# Patient Record
Sex: Male | Born: 1970 | Race: White | Hispanic: No | Marital: Married | Smoking: Current every day smoker
Health system: Southern US, Community
[De-identification: ages and names within clinical notes are randomized; demographics above are authoritative.]

## PROBLEM LIST (undated history)

## (undated) DIAGNOSIS — R112 Nausea with vomiting, unspecified: Secondary | ICD-10-CM

## (undated) DIAGNOSIS — I639 Cerebral infarction, unspecified: Secondary | ICD-10-CM

## (undated) DIAGNOSIS — Z9889 Other specified postprocedural states: Secondary | ICD-10-CM

## (undated) DIAGNOSIS — T8859XA Other complications of anesthesia, initial encounter: Secondary | ICD-10-CM

## (undated) DIAGNOSIS — E7800 Pure hypercholesterolemia, unspecified: Secondary | ICD-10-CM

## (undated) DIAGNOSIS — M6282 Rhabdomyolysis: Secondary | ICD-10-CM

## (undated) DIAGNOSIS — T4145XA Adverse effect of unspecified anesthetic, initial encounter: Secondary | ICD-10-CM

## (undated) HISTORY — PX: PATELLA FRACTURE SURGERY: SHX735

## (undated) HISTORY — PX: ANKLE SURGERY: SHX546

## (undated) HISTORY — DX: Cerebral infarction, unspecified: I63.9

---

## 2014-09-03 ENCOUNTER — Encounter (HOSPITAL_BASED_OUTPATIENT_CLINIC_OR_DEPARTMENT_OTHER): Payer: Self-pay | Admitting: *Deleted

## 2014-09-03 ENCOUNTER — Emergency Department (HOSPITAL_BASED_OUTPATIENT_CLINIC_OR_DEPARTMENT_OTHER)
Admission: EM | Admit: 2014-09-03 | Discharge: 2014-09-03 | Disposition: A | Discharge status: Home / Self Care | Payer: Self-pay | Attending: Emergency Medicine | Admitting: Emergency Medicine

## 2014-09-03 DIAGNOSIS — L03115 Cellulitis of right lower limb: Secondary | ICD-10-CM | POA: Insufficient documentation

## 2014-09-03 DIAGNOSIS — Z720 Tobacco use: Secondary | ICD-10-CM | POA: Insufficient documentation

## 2014-09-03 DIAGNOSIS — Z792 Long term (current) use of antibiotics: Secondary | ICD-10-CM | POA: Insufficient documentation

## 2014-09-03 DIAGNOSIS — Z79899 Other long term (current) drug therapy: Secondary | ICD-10-CM | POA: Insufficient documentation

## 2014-09-03 LAB — BASIC METABOLIC PANEL
ANION GAP: 13 (ref 5–15)
BUN: 16 mg/dL (ref 6–23)
CALCIUM: 9 mg/dL (ref 8.4–10.5)
CO2: 24 meq/L (ref 19–32)
Chloride: 99 mEq/L (ref 96–112)
Creatinine, Ser: 1.1 mg/dL (ref 0.50–1.35)
GFR calc non Af Amer: 81 mL/min — ABNORMAL LOW (ref >90)
Glucose, Bld: 113 mg/dL — ABNORMAL HIGH (ref 70–99)
Potassium: 3.8 mEq/L (ref 3.7–5.3)
Sodium: 136 mEq/L — ABNORMAL LOW (ref 137–147)

## 2014-09-03 LAB — CBC WITH DIFFERENTIAL/PLATELET
BASOS ABS: 0 10*3/uL (ref 0.0–0.1)
BASOS PCT: 1 % (ref 0–1)
EOS PCT: 2 % (ref 0–5)
Eosinophils Absolute: 0.1 10*3/uL (ref 0.0–0.7)
HEMATOCRIT: 40.4 % (ref 39.0–52.0)
Hemoglobin: 13.9 g/dL (ref 13.0–17.0)
Lymphocytes Relative: 21 % (ref 12–46)
Lymphs Abs: 1.2 10*3/uL (ref 0.7–4.0)
MCH: 27.2 pg (ref 26.0–34.0)
MCHC: 34.4 g/dL (ref 30.0–36.0)
MCV: 79.1 fL (ref 78.0–100.0)
Monocytes Absolute: 0.8 10*3/uL (ref 0.1–1.0)
Monocytes Relative: 14 % — ABNORMAL HIGH (ref 3–12)
Neutro Abs: 3.6 10*3/uL (ref 1.7–7.7)
Neutrophils Relative %: 62 % (ref 43–77)
Platelets: 309 10*3/uL (ref 150–400)
RBC: 5.11 MIL/uL (ref 4.22–5.81)
RDW: 12.7 % (ref 11.5–15.5)
WBC: 5.7 10*3/uL (ref 4.0–10.5)

## 2014-09-03 MED ORDER — VANCOMYCIN HCL IN DEXTROSE 1-5 GM/200ML-% IV SOLN: 1000.0000 mg | Freq: Two times a day (BID) | INTRAVENOUS | Status: DC

## 2014-09-03 MED ORDER — VANCOMYCIN HCL IN DEXTROSE 1-5 GM/200ML-% IV SOLN
INTRAVENOUS | Status: AC
  Administered 2014-09-03: 1000 mg via INTRAVENOUS
  Filled 2014-09-03: qty 400

## 2014-09-03 MED ORDER — VANCOMYCIN HCL IN DEXTROSE 1-5 GM/200ML-% IV SOLN
1000.0000 mg | Freq: Once | INTRAVENOUS | Status: AC
  Administered 2014-09-03: 1000 mg via INTRAVENOUS

## 2014-09-03 MED ORDER — AMOXICILLIN-POT CLAVULANATE 875-125 MG PO TABS: 1.0000 | ORAL_TABLET | Freq: Two times a day (BID) | ORAL | Status: DC

## 2014-09-03 NOTE — Discharge Instructions (Signed)
Expect slow improvement of the redness in the leg. Complete the course of doxycycline for previous prescription. New prescription Augmentin, take both until completed. Twice per day (over the counter) probiotic to avoid diarrhea from antibiotics.  Cellulitis Cellulitis is an infection of the skin and the tissue under the skin. The infected area is usually red and tender. This happens most often in the arms and lower legs. HOME CARE   Take your antibiotic medicine as told. Finish the medicine even if you start to feel better.  Keep the infected arm or leg raised (elevated).  Put a warm cloth on the area up to 4 times per day.  Only take medicines as told by your doctor.  Keep all doctor visits as told. GET HELP IF:  You see red streaks on the skin coming from the infected area.  Your red area gets bigger or turns a dark color.  Your bone or joint under the infected area is painful after the skin heals.  Your infection comes back in the same area or different area.  You have a puffy (swollen) bump in the infected area.  You have new symptoms.  You have a fever. GET HELP RIGHT AWAY IF:   You feel very sleepy.  You throw up (vomit) or have watery poop (diarrhea).  You feel sick and have muscle aches and pains. MAKE SURE YOU:   Understand these instructions.  Will watch your condition.  Will get help right away if you are not doing well or get worse. Document Released: 02/25/2008 Document Revised: 01/23/2014 Document Reviewed: 11/24/2011 [facility] Patient Information 2015 [place], [place]. This information is not intended to replace advice given to you by your health care provider. Make sure you discuss any questions you have with your health care provider.

## 2014-09-03 NOTE — ED Notes (Signed)
Patient c/o fever over the past week and rash/infection on lower legs. Currently on doxy-cycline after going to urgent care

## 2014-09-03 NOTE — ED Provider Notes (Signed)
CSN: [id number]     Arrival date & time 09/03/14  1025 History   First MD Initiated Contact with Patient 09/03/14 0804     Chief Complaint  Patient presents with  . Cellulitis      HPI  Patient presents for evaluation of fever and rash on his legs. Started noticing some redness and tenderness on the right lower leg about 5 or 6 days ago. Fevers up to 100 at home each night. Some sweats at night as well. He has been active. He continues to work. He is on his feet a lot. Seen in urgent care about 36 hours ago. Placed on doxycycline. Has had 3 doses. Was concerned that it was "getting worse", he presents here. Past medical history otherwise healthy. Previous ORIF of right lower leg fracture over 10 years ago.   History reviewed. No pertinent past medical history. Past Surgical History  Procedure Laterality Date  . Patella fracture surgery Right    No family history on file. History  Substance Use Topics  . Smoking status: Current Every Day Smoker  . Smokeless tobacco: Not on file  . Alcohol Use: Yes    Review of Systems  Constitutional: Positive for fever. Negative for chills, diaphoresis, appetite change and fatigue.  HENT: Negative for mouth sores, sore throat and trouble swallowing.   Eyes: Negative for visual disturbance.  Respiratory: Negative for cough, chest tightness, shortness of breath and wheezing.   Cardiovascular: Negative for chest pain.  Gastrointestinal: Negative for nausea, vomiting, abdominal pain, diarrhea and abdominal distention.  Endocrine: Negative for polydipsia, polyphagia and polyuria.  Genitourinary: Negative for dysuria, frequency and hematuria.  Musculoskeletal: Negative for gait problem.       Leg pain. Redness.  Skin: Negative for color change, pallor and rash.  Neurological: Negative for dizziness, syncope, light-headedness and headaches.  Hematological: Does not bruise/bleed easily.  Psychiatric/Behavioral: Negative for behavioral problems and  confusion.      Allergies  Review of patient's allergies indicates no known allergies.  Home Medications   Prior to Admission medications   Medication Sig Start Date End Date Taking? Authorizing Provider  cyclobenzaprine (FLEXERIL) 10 MG tablet Take 10 mg by mouth every 8 (eight) hours as needed for muscle spasms.   Yes Historical Provider, MD  doxycycline (DORYX) 100 MG DR capsule Take 100 mg by mouth 2 (two) times daily.   Yes Historical Provider, MD  ranitidine (ZANTAC) 150 MG tablet Take 150 mg by mouth daily.   Yes Historical Provider, MD  traMADol (ULTRAM) 50 MG tablet Take by mouth every 12 (twelve) hours as needed.   Yes Historical Provider, MD  amoxicillin-clavulanate (AUGMENTIN) 875-125 MG per tablet Take 1 tablet by mouth 2 (two) times daily. 09/03/14   Tanna Furry, MD   BP 125/74 mmHg  Pulse 67  Temp(Src) 98.1 F (36.7 C) (Oral)  Resp 18  Ht 6' (1.829 m)  Wt 235 lb (106.595 kg)  BMI 31.86 kg/m2  SpO2 99% Physical Exam  Constitutional: He is oriented to person, place, and time. He appears well-developed and well-nourished. No distress.  HENT:  Head: Normocephalic.  Eyes: Conjunctivae are normal. Pupils are equal, round, and reactive to light. No scleral icterus.  Neck: Normal range of motion. Neck supple. No thyromegaly present.  Cardiovascular: Normal rate and regular rhythm.  Exam reveals no gallop and no friction rub.   No murmur heard. Pulmonary/Chest: Effort normal and breath sounds normal. No respiratory distress. He has no wheezes. He has no rales.  Abdominal: Soft. Bowel sounds are normal. He exhibits no distension. There is no tenderness. There is no rebound.  Musculoskeletal: Normal range of motion.       Legs: Neurological: He is alert and oriented to person, place, and time.  Skin: Skin is warm and dry. No rash noted.  Psychiatric: He has a normal mood and affect. His behavior is normal.    ED Course  Procedures (including critical care time) Labs  Review Labs Reviewed  CBC WITH DIFFERENTIAL - Abnormal; Notable for the following:    Monocytes Relative 14 (*)    All other components within normal limits  BASIC METABOLIC PANEL - Abnormal; Notable for the following:    Sodium 136 (*)    Glucose, Bld 113 (*)    GFR calc non Af Amer 81 (*)    All other components within normal limits    Imaging Review No results found.   EKG Interpretation None      MDM   Final diagnoses:  Cellulitis of right lower extremity    Given IV vancomycin. Plan is discharge. Complete doxycycline. We will add Augmentin. Should be good MRSA, MSSA, and strep coverage. Legs are not asymmetric. No clinical signs of DVT. No compromise or breakdown of skin. I have advised daily probiotic to avoid antibiotics associated diarrhea. Expect slow resolution. Primary care with any failure to improve.    Tanna Furry, MD 09/03/14 [phone]

## 2014-09-03 NOTE — Progress Notes (Addendum)
ANTIBIOTIC CONSULT NOTE - INITIAL  Pharmacy Consult for vancomycin Indication: Cellulitis  No Known Allergies  Patient Measurements: Height: 6' (182.9 cm) Weight: 235 lb (106.595 kg) IBW/kg (Calculated) : 77.6  Vital Signs: Temp: 98.1 F (36.7 C) (12/13 0753) Temp Source: Oral (12/13 0753) BP: 143/83 mmHg (12/13 0753) Pulse Rate: 76 (12/13 0753) Intake/Output from previous day:   Intake/Output from this shift:    Labs: No results for input(s): WBC, HGB, PLT, LABCREA, CREATININE in the last 72 hours. CrCl cannot be calculated (Patient has no serum creatinine result on file.). No results for input(s): VANCOTROUGH, VANCOPEAK, VANCORANDOM, GENTTROUGH, GENTPEAK, GENTRANDOM, TOBRATROUGH, TOBRAPEAK, TOBRARND, AMIKACINPEAK, AMIKACINTROU, AMIKACIN in the last 72 hours.   Microbiology: No results found for this or any previous visit (from the past 720 hour(s)).  Medical History: History reviewed. No pertinent past medical history.  Assessment: 43 yo male with fever and lower leg rash to begin vancomycin for cellulitis. WBC= 5.7, afeb, SCr= 1.1 and CrCl > 100.  Goal of Therapy:  Vancomycin trough level 15-20 mcg/ml  Plan:  -Vancomycin 20101m IV load x1 followed by 10068mIV q12h -Will follow renal function and clinical progress  AnHildred LaserPharm D 09/03/2014 8:40 AM

## 2014-09-12 ENCOUNTER — Emergency Department (HOSPITAL_BASED_OUTPATIENT_CLINIC_OR_DEPARTMENT_OTHER): Payer: Self-pay

## 2014-09-12 ENCOUNTER — Encounter (HOSPITAL_BASED_OUTPATIENT_CLINIC_OR_DEPARTMENT_OTHER): Payer: Self-pay

## 2014-09-12 ENCOUNTER — Emergency Department (HOSPITAL_BASED_OUTPATIENT_CLINIC_OR_DEPARTMENT_OTHER)
Admission: EM | Admit: 2014-09-12 | Discharge: 2014-09-12 | Disposition: A | Discharge status: Home / Self Care | Payer: Self-pay | Attending: Emergency Medicine | Admitting: Emergency Medicine

## 2014-09-12 DIAGNOSIS — Z79899 Other long term (current) drug therapy: Secondary | ICD-10-CM | POA: Insufficient documentation

## 2014-09-12 DIAGNOSIS — R109 Unspecified abdominal pain: Secondary | ICD-10-CM

## 2014-09-12 DIAGNOSIS — R350 Frequency of micturition: Secondary | ICD-10-CM

## 2014-09-12 DIAGNOSIS — Z720 Tobacco use: Secondary | ICD-10-CM | POA: Insufficient documentation

## 2014-09-12 DIAGNOSIS — Z792 Long term (current) use of antibiotics: Secondary | ICD-10-CM | POA: Insufficient documentation

## 2014-09-12 DIAGNOSIS — I880 Nonspecific mesenteric lymphadenitis: Secondary | ICD-10-CM | POA: Insufficient documentation

## 2014-09-12 DIAGNOSIS — R1030 Lower abdominal pain, unspecified: Secondary | ICD-10-CM | POA: Insufficient documentation

## 2014-09-12 LAB — URINALYSIS, ROUTINE W REFLEX MICROSCOPIC
BILIRUBIN URINE: NEGATIVE
Glucose, UA: NEGATIVE mg/dL
Hgb urine dipstick: NEGATIVE
KETONES UR: NEGATIVE mg/dL
Leukocytes, UA: NEGATIVE
NITRITE: NEGATIVE
Protein, ur: NEGATIVE mg/dL
Specific Gravity, Urine: 1.009 (ref 1.005–1.030)
Urobilinogen, UA: 0.2 mg/dL (ref 0.0–1.0)
pH: 5.5 (ref 5.0–8.0)

## 2014-09-12 NOTE — ED Notes (Signed)
Patient denies pain and is resting comfortably.  

## 2014-09-12 NOTE — Discharge Instructions (Signed)
Follow-up with the primary care physician as scheduled next month. You will need reevaluation of the mesenteric adenitis in 3-6 months. Continue taking your antibiotics as prescribed.  Flank Pain Flank pain refers to pain that is located on the side of the body between the upper abdomen and the back. The pain may occur over a short period of time (acute) or may be long-term or reoccurring (chronic). It may be mild or severe. Flank pain can be caused by many things. CAUSES  Some of the more common causes of flank pain include:  Muscle strains.   Muscle spasms.   A disease of your spine (vertebral disk disease).   A lung infection (pneumonia).   Fluid around your lungs (pulmonary edema).   A kidney infection.   Kidney stones.   A very painful skin rash caused by the chickenpox virus (shingles).   Gallbladder disease.  [place] care will depend on the cause of your pain. In general,  Rest as directed by your caregiver.  Drink enough fluids to keep your urine clear or pale yellow.  Only take over-the-counter or prescription medicines as directed by your caregiver. Some medicines may help relieve the pain.  Tell your caregiver about any changes in your pain.  Follow up with your caregiver as directed. SEEK IMMEDIATE MEDICAL CARE IF:   Your pain is not controlled with medicine.   You have new or worsening symptoms.  Your pain increases.   You have abdominal pain.   You have shortness of breath.   You have persistent nausea or vomiting.   You have swelling in your abdomen.   You feel faint or pass out.   You have blood in your urine.  You have a fever or persistent symptoms for more than 2-3 days.  You have a fever and your symptoms suddenly get worse. MAKE SURE YOU:   Understand these instructions.  Will watch your condition.  Will get help right away if you are not doing well or get worse. Document Released: 10/30/2005  Document Revised: 06/02/2012 Document Reviewed: 04/22/2012 [facility] Patient Information 2015 [place], [place]. This information is not intended to replace advice given to you by your health care provider. Make sure you discuss any questions you have with your health care provider.  Lymphadenopathy Lymphadenopathy means "disease of the lymph glands." But the term is usually used to describe swollen or enlarged lymph glands, also called lymph nodes. These are the bean-shaped organs found in many locations including the neck, underarm, and groin. Lymph glands are part of the immune system, which fights infections in your body. Lymphadenopathy can occur in just one area of the body, such as the neck, or it can be generalized, with lymph node enlargement in several areas. The nodes found in the neck are the most common sites of lymphadenopathy. CAUSES When your immune system responds to germs (such as viruses or bacteria ), infection-fighting cells and fluid build up. This causes the glands to grow in size. Usually, this is not something to worry about. Sometimes, the glands themselves can become infected and inflamed. This is called lymphadenitis. Enlarged lymph nodes can be caused by many diseases:  Bacterial disease, such as strep throat or a skin infection.  Viral disease, such as a common cold.  Other germs, such as Lyme disease, tuberculosis, or sexually transmitted diseases.  Cancers, such as lymphoma (cancer of the lymphatic system) or leukemia (cancer of the white blood cells).  Inflammatory diseases such as lupus  or rheumatoid arthritis.  Reactions to medications. Many of the diseases above are rare, but important. This is why you should see your caregiver if you have lymphadenopathy. SYMPTOMS  Swollen, enlarged lumps in the neck, back of the head, or other locations.  Tenderness.  Warmth or redness of the skin over the lymph nodes.  Fever. DIAGNOSIS Enlarged lymph nodes are often  near the source of infection. They can help health care providers diagnose your illness. For instance:  Swollen lymph nodes around the jaw might be caused by an infection in the mouth.  Enlarged glands in the neck often signal a throat infection.  Lymph nodes that are swollen in more than one area often indicate an illness caused by a virus. Your caregiver will likely know what is causing your lymphadenopathy after listening to your history and examining you. Blood tests, x-rays, or other tests may be needed. If the cause of the enlarged lymph node cannot be found, and it does not go away by itself, then a biopsy may be needed. Your caregiver will discuss this with you. TREATMENT Treatment for your enlarged lymph nodes will depend on the cause. Many times the nodes will shrink to normal size by themselves, with no treatment. Antibiotics or other medicines may be needed for infection. Only take over-the-counter or prescription medicines for pain, discomfort, or fever as directed by your caregiver. HOME CARE INSTRUCTIONS Swollen lymph glands usually return to normal when the underlying medical condition goes away. If they persist, contact your health-care provider. He/she might prescribe antibiotics or other treatments, depending on the diagnosis. Take any medications exactly as prescribed. Keep any follow-up appointments made to check on the condition of your enlarged nodes. SEEK MEDICAL CARE IF:  Swelling lasts for more than two weeks.  You have symptoms such as weight loss, night sweats, fatigue, or fever that does not go away.  The lymph nodes are hard, seem fixed to the skin, or are growing rapidly.  Skin over the lymph nodes is red and inflamed. This could mean there is an infection. SEEK IMMEDIATE MEDICAL CARE IF:  Fluid starts leaking from the area of the enlarged lymph node.  You develop a fever of 102 F (38.9 C) or greater.  Severe pain develops (not necessarily at the site of a  large lymph node).  You develop chest pain or shortness of breath.  You develop worsening abdominal pain. MAKE SURE YOU:  Understand these instructions.  Will watch your condition.  Will get help right away if you are not doing well or get worse. Document Released: 06/17/2008 Document Revised: 01/23/2014 Document Reviewed: 06/17/2008 [facility] Patient Information 2015 [place], [place]. This information is not intended to replace advice given to you by your health care provider. Make sure you discuss any questions you have with your health care provider.  Urinary Frequency The number of times a normal person urinates depends upon how much liquid they take in and how much liquid they are losing. If the temperature is hot and there is high humidity, then the person will sweat more and usually breathe a little more frequently. These factors decrease the amount of frequency of urination that would be considered normal. The amount you drink is easily determined, but the amount of fluid lost is sometimes more difficult to calculate.  Fluid is lost in two ways:  Sensible fluid loss is usually measured by the amount of urine that you get rid of. Losses of fluid can also occur with diarrhea.  Insensible  fluid loss is more difficult to measure. It is caused by evaporation. Insensible loss of fluid occurs through breathing and sweating. It usually ranges from a little less than a quart to a little more than a quart of fluid a day. In normal temperatures and activity levels, the average person may urinate 4 to 7 times in a 24-hour period. Needing to urinate more often than that could indicate a problem. If one urinates 4 to 7 times in 24 hours and has large volumes each time, that could indicate a different problem from one who urinates 4 to 7 times a day and has small volumes. The time of urinating is also important. Most urinating should be done during the waking hours. Getting up at night to urinate  frequently can indicate some problems. CAUSES  The bladder is the organ in your lower abdomen that holds urine. Like a balloon, it swells some as it fills up. Your nerves sense this and tell you it is time to head for the bathroom. There are a number of reasons that you might feel the need to urinate more often than usual. They include:  Urinary tract infection. This is usually associated with other signs such as burning when you urinate.  In men, problems with the prostate (a walnut-size gland that is located near the tube that carries urine out of your body). There are two reasons why the prostate can cause an increased frequency of urination:  An enlarged prostate that does not let the bladder empty well. If the bladder only half empties when you urinate, then it only has half the capacity to fill before you have to urinate again.  The nerves in the bladder become more hypersensitive with an increased size of the prostate even if the bladder empties completely.  Pregnancy.  Obesity. Excess weight is more likely to cause a problem for women than for men.  Bladder stones or other bladder problems.  Caffeine.  Alcohol.  Medications. For example, drugs that help the body get rid of extra fluid (diuretics) increase urine production. Some other medicines must be taken with lots of fluids.  Muscle or nerve weakness. This might be the result of a spinal cord injury, a stroke, multiple sclerosis, or Parkinson disease.  Long-standing diabetes can decrease the sensation of the bladder. This loss of sensation makes it harder to sense the bladder needs to be emptied. Over a period of years, the bladder is stretched out by constant overfilling. This weakens the bladder muscles so that the bladder does not empty well and has less capacity to fill with new urine.  Interstitial cystitis (also called painful bladder syndrome). This condition develops because the tissues that line the inside of the  bladder are inflamed (inflammation is the body's way of reacting to injury or infection). It causes pain and frequent urination. It occurs in women more often than in men. DIAGNOSIS   To decide what might be causing your urinary frequency, your health care provider will probably:  Ask about symptoms you have noticed.  Ask about your overall health. This will include questions about any medications you are taking.  Do a physical examination.  Order some tests. These might include:  A blood test to check for diabetes or other health issues that could be contributing to the problem.  Urine testing. This could measure the flow of urine and the pressure on the bladder.  A test of your neurological system (the brain, spinal cord, and nerves). This is the  system that senses the need to urinate.  A bladder test to check whether it is emptying completely when you urinate.  Cystoscopy. This test uses a thin tube with a tiny camera on it. It offers a look inside your urethra and bladder to see if there are problems.  Imaging tests. You might be given a contrast dye and then asked to urinate. X-rays are taken to see how your bladder is working. TREATMENT  It is important for you to be evaluated to determine if the amount or frequency that you have is unusual or abnormal. If it is found to be abnormal, the cause should be determined and this can usually be found out easily. Depending upon the cause, treatment could include medication, stimulation of the nerves, or surgery. There are not too many things that you can do as an individual to change your urinary frequency. It is important that you balance the amount of fluid intake needed to compensate for your activity and the temperature. Medical problems will be diagnosed and taken care of by your physician. There is no particular bladder training such as Kegel exercises that you can do to help urinary frequency. This is an exercise that is usually  recommended for people who have leaking of urine when they laugh, cough, or sneeze. HOME CARE INSTRUCTIONS   Take any medications your health care provider prescribed or suggested. Follow the directions carefully.  Practice any lifestyle changes that are recommended. These might include:  Drinking less fluid or drinking at different times of the day. If you need to urinate often during the night, for example, you may need to stop drinking fluids early in the evening.  Cutting down on caffeine or alcohol. They both can make you need to urinate more often than normal. Caffeine is found in coffee, tea, and sodas.  Losing weight, if that is recommended.  Keep a journal or a log. You might be asked to record how much you drink and when and where you feel the need to urinate. This will also help evaluate how well the treatment provided by your physician is working. SEEK MEDICAL CARE IF:   Your need to urinate often gets worse.  You feel increased pain or irritation when you urinate.  You notice blood in your urine.  You have questions about any medications that your health care provider recommended.  You notice blood, pus, or swelling at the site of any test or treatment procedure.  You develop a fever of more than 100.69F (38.1C). SEEK IMMEDIATE MEDICAL CARE IF:  You develop a fever of more than 102.77F (38.9C). Document Released: 07/05/2009 Document Revised: 01/23/2014 Document Reviewed: 07/05/2009 [facility] Patient Information 2015 [place], [place]. This information is not intended to replace advice given to you by your health care provider. Make sure you discuss any questions you have with your health care provider.

## 2014-09-12 NOTE — ED Provider Notes (Signed)
CSN: [id number]     Arrival date & time 09/12/14  1712 History   First MD Initiated Contact with Patient 09/12/14 1720     Chief Complaint  Patient presents with  . Urinary Frequency     (Consider location/radiation/quality/duration/timing/severity/associated sxs/prior Treatment) HPI Comments: 43 year old male presenting with increased urinary frequency and right-sided flank pain radiating around to the front of his abdomen into the left side of his back 1 week, worsening over the past few days. Patient was started on Augmentin in addition to doxycycline 9 days ago for treatment for cellulitis, and patient states over the past week has been experiencing increased urinary frequency and urgency. The flank pain has been gradually developing. He states pain is worse at night and makes him up with a severe, sharp pain. No specific aggravating or alleviating factors. Denies dysuria, hematuria, testicular swelling, penile pain or swelling. Denies fever, chills, nausea or vomiting. Denies any history of kidney stones.  The history is provided by the patient.    History reviewed. No pertinent past medical history. Past Surgical History  Procedure Laterality Date  . Patella fracture surgery Right    No family history on file. History  Substance Use Topics  . Smoking status: Current Every Day Smoker  . Smokeless tobacco: Not on file  . Alcohol Use: Yes    Review of Systems  10 Systems reviewed and are negative for acute change except as noted in the HPI.  Allergies  Review of patient's allergies indicates no known allergies.  Home Medications   Prior to Admission medications   Medication Sig Start Date End Date Taking? Authorizing Provider  amoxicillin-clavulanate (AUGMENTIN) 875-125 MG per tablet Take 1 tablet by mouth 2 (two) times daily. 09/03/14   Tanna Furry, MD  cyclobenzaprine (FLEXERIL) 10 MG tablet Take 10 mg by mouth every 8 (eight) hours as needed for muscle spasms.     Historical Provider, MD  doxycycline (DORYX) 100 MG DR capsule Take 100 mg by mouth 2 (two) times daily.    Historical Provider, MD  ranitidine (ZANTAC) 150 MG tablet Take 150 mg by mouth daily.    Historical Provider, MD  traMADol (ULTRAM) 50 MG tablet Take by mouth every 12 (twelve) hours as needed.    Historical Provider, MD   BP 164/90 mmHg  Pulse 85  Temp(Src) 98.5 F (36.9 C) (Oral)  Resp 16  Ht 5' 9"  (1.753 m)  Wt 175 lb (79.379 kg)  BMI 25.83 kg/m2  SpO2 100% Physical Exam  Constitutional: He is oriented to person, place, and time. He appears well-developed and well-nourished. No distress.  HENT:  Head: Normocephalic and atraumatic.  Mouth/Throat: Oropharynx is clear and moist.  Eyes: Conjunctivae are normal.  Neck: Normal range of motion. Neck supple.  Cardiovascular: Normal rate, regular rhythm and normal heart sounds.   Pulmonary/Chest: Effort normal and breath sounds normal.  Abdominal: Soft. Bowel sounds are normal. He exhibits no distension and no mass. There is no tenderness. There is no rebound and no guarding.  Bilateral CVAT.  Musculoskeletal: Normal range of motion. He exhibits no edema.  Neurological: He is alert and oriented to person, place, and time.  Skin: Skin is warm and dry. He is not diaphoretic.  Psychiatric: He has a normal mood and affect. His behavior is normal.  Nursing note and vitals reviewed.   ED Course  Procedures (including critical care time) Labs Review Labs Reviewed  URINALYSIS, ROUTINE W REFLEX MICROSCOPIC    Imaging Review Ct Renal Stone  Study  09/12/2014   CLINICAL DATA:  Right flank pain and lower abdominal pain for 1 week  EXAM: CT ABDOMEN AND PELVIS WITHOUT CONTRAST  TECHNIQUE: Multidetector CT imaging of the abdomen and pelvis was performed following the standard protocol without IV contrast.  COMPARISON:  None.  FINDINGS: No hydronephrosis.  No urinary calculus.  5 mm hypodensity at the dome of the liver is nonspecific.   Spleen, pancreas, adrenal glands, gallbladder are within normal limits  Normal appendix.  Prostate and bladder are unremarkable  Right inguinal hernia contains only adipose tissue.  No free-fluid.  Innumerable mesenteric lymph nodes are present. The largest is 9 mm in diameter. There is haziness surrounding the mesenteric lymph nodes. 13 mm left periaortic lymph node. Other smaller retroperitoneal nodes are noted.  No destructive bone lesion.  IMPRESSION: No evidence of urinary calculus or obstruction.  Mild mesenteric and retroperitoneal adenopathy with adjacent stranding. These findings likely represent an inflammatory. A lymphoproliferative disorder is not excluded. Three to six month followup CT abdomen and pelvis is recommended to ensure resolution.  5 mm nonspecific hypodensity at the dome of the liver. This can be assessed on follow-up as described above.   Electronically Signed   By: Maryclare Bean M.D.   On: 09/12/2014 18:06     EKG Interpretation None      MDM   Final diagnoses:  Flank pain  Mesenteric adenitis  Urinary frequency   Patient in no apparent distress. Afebrile, vital signs stable. Abdomen is soft and nontender. Mild bilateral CVA tenderness. On antibiotics for cellulitis of his lower extremities which she states is healing well. Symptoms began after starting antibiotics. CT scan obtained to rule out any urinary tract stone. CT scan showing no evidence of urinary calculus or obstruction. Mild mesenteric and retroperitoneal adenopathy with adjacent stranding noted. Discussed these findings with patient who he will discuss with the PCP that he is establishing care with next month. I advised him to continue taking the antibiotics, doxycycline and Augmentin until completed. Urinalysis negative for infection. No hematuria. While sitting in the emergency department, he is denying any pain at present. He is stable for discharge. Return precautions given. Patient states understanding of  treatment care plan and is agreeable.   Carman Ching, PA-C 09/12/14 1841  Quintella Reichert, MD 09/12/14 2105

## 2014-09-12 NOTE — ED Notes (Signed)
Reports urinary frequency and right flank pain. Denies nausea and vomiting

## 2015-05-05 ENCOUNTER — Observation Stay (HOSPITAL_COMMUNITY): Payer: BC Managed Care – PPO

## 2015-05-05 ENCOUNTER — Emergency Department (HOSPITAL_BASED_OUTPATIENT_CLINIC_OR_DEPARTMENT_OTHER): Payer: BC Managed Care – PPO

## 2015-05-05 ENCOUNTER — Encounter (HOSPITAL_BASED_OUTPATIENT_CLINIC_OR_DEPARTMENT_OTHER): Payer: Self-pay | Admitting: *Deleted

## 2015-05-05 ENCOUNTER — Inpatient Hospital Stay (HOSPITAL_BASED_OUTPATIENT_CLINIC_OR_DEPARTMENT_OTHER)
Admission: EM | Admit: 2015-05-05 | Discharge: 2015-05-07 | DRG: 066 | Disposition: A | Discharge status: Home / Self Care | Payer: BC Managed Care – PPO | Attending: Internal Medicine | Admitting: Internal Medicine

## 2015-05-05 DIAGNOSIS — F1290 Cannabis use, unspecified, uncomplicated: Secondary | ICD-10-CM | POA: Diagnosis present

## 2015-05-05 DIAGNOSIS — I6339 Cerebral infarction due to thrombosis of other cerebral artery: Secondary | ICD-10-CM | POA: Diagnosis not present

## 2015-05-05 DIAGNOSIS — I440 Atrioventricular block, first degree: Secondary | ICD-10-CM | POA: Diagnosis present

## 2015-05-05 DIAGNOSIS — R269 Unspecified abnormalities of gait and mobility: Secondary | ICD-10-CM | POA: Diagnosis present

## 2015-05-05 DIAGNOSIS — R471 Dysarthria and anarthria: Secondary | ICD-10-CM | POA: Diagnosis present

## 2015-05-05 DIAGNOSIS — Z23 Encounter for immunization: Secondary | ICD-10-CM

## 2015-05-05 DIAGNOSIS — Z720 Tobacco use: Secondary | ICD-10-CM | POA: Diagnosis not present

## 2015-05-05 DIAGNOSIS — R1310 Dysphagia, unspecified: Secondary | ICD-10-CM | POA: Diagnosis present

## 2015-05-05 DIAGNOSIS — E669 Obesity, unspecified: Secondary | ICD-10-CM | POA: Diagnosis present

## 2015-05-05 DIAGNOSIS — I495 Sick sinus syndrome: Secondary | ICD-10-CM | POA: Diagnosis not present

## 2015-05-05 DIAGNOSIS — E785 Hyperlipidemia, unspecified: Secondary | ICD-10-CM | POA: Diagnosis not present

## 2015-05-05 DIAGNOSIS — I639 Cerebral infarction, unspecified: Secondary | ICD-10-CM | POA: Diagnosis not present

## 2015-05-05 DIAGNOSIS — R0981 Nasal congestion: Secondary | ICD-10-CM | POA: Diagnosis present

## 2015-05-05 DIAGNOSIS — I10 Essential (primary) hypertension: Secondary | ICD-10-CM | POA: Insufficient documentation

## 2015-05-05 DIAGNOSIS — F1210 Cannabis abuse, uncomplicated: Secondary | ICD-10-CM | POA: Diagnosis not present

## 2015-05-05 DIAGNOSIS — F17210 Nicotine dependence, cigarettes, uncomplicated: Secondary | ICD-10-CM | POA: Diagnosis present

## 2015-05-05 DIAGNOSIS — R29810 Facial weakness: Secondary | ICD-10-CM | POA: Diagnosis present

## 2015-05-05 DIAGNOSIS — Z823 Family history of stroke: Secondary | ICD-10-CM

## 2015-05-05 DIAGNOSIS — M79601 Pain in right arm: Secondary | ICD-10-CM | POA: Diagnosis present

## 2015-05-05 DIAGNOSIS — G4733 Obstructive sleep apnea (adult) (pediatric): Secondary | ICD-10-CM | POA: Diagnosis present

## 2015-05-05 DIAGNOSIS — Z6832 Body mass index (BMI) 32.0-32.9, adult: Secondary | ICD-10-CM

## 2015-05-05 DIAGNOSIS — R12 Heartburn: Secondary | ICD-10-CM | POA: Diagnosis present

## 2015-05-05 LAB — URINALYSIS, ROUTINE W REFLEX MICROSCOPIC
BILIRUBIN URINE: NEGATIVE
Glucose, UA: NEGATIVE mg/dL
Hgb urine dipstick: NEGATIVE
KETONES UR: NEGATIVE mg/dL
Leukocytes, UA: NEGATIVE
Nitrite: NEGATIVE
PH: 7 (ref 5.0–8.0)
Protein, ur: NEGATIVE mg/dL
SPECIFIC GRAVITY, URINE: 1.015 (ref 1.005–1.030)
UROBILINOGEN UA: 0.2 mg/dL (ref 0.0–1.0)

## 2015-05-05 LAB — LIPID PANEL
Cholesterol: 238 mg/dL — ABNORMAL HIGH (ref 0–200)
HDL: 31 mg/dL — AB (ref >40)
LDL CALC: 142 mg/dL — AB (ref 0–99)
TRIGLYCERIDES: 325 mg/dL — AB (ref <150)
Total CHOL/HDL Ratio: 7.7 RATIO
VLDL: 65 mg/dL — AB (ref 0–40)

## 2015-05-05 LAB — COMPREHENSIVE METABOLIC PANEL
ALT: 38 U/L (ref 17–63)
ANION GAP: 8 (ref 5–15)
AST: 31 U/L (ref 15–41)
Albumin: 4.3 g/dL (ref 3.5–5.0)
Alkaline Phosphatase: 59 U/L (ref 38–126)
BUN: 14 mg/dL (ref 6–20)
CHLORIDE: 107 mmol/L (ref 101–111)
CO2: 23 mmol/L (ref 22–32)
Calcium: 9.7 mg/dL (ref 8.9–10.3)
Creatinine, Ser: 0.89 mg/dL (ref 0.61–1.24)
GLUCOSE: 119 mg/dL — AB (ref 65–99)
POTASSIUM: 4.1 mmol/L (ref 3.5–5.1)
SODIUM: 138 mmol/L (ref 135–145)
TOTAL PROTEIN: 6.7 g/dL (ref 6.5–8.1)
Total Bilirubin: 0.7 mg/dL (ref 0.3–1.2)

## 2015-05-05 LAB — CBC
HCT: 46 % (ref 39.0–52.0)
Hemoglobin: 16 g/dL (ref 13.0–17.0)
MCH: 28.1 pg (ref 26.0–34.0)
MCHC: 34.8 g/dL (ref 30.0–36.0)
MCV: 80.8 fL (ref 78.0–100.0)
Platelets: 257 10*3/uL (ref 150–400)
RBC: 5.69 MIL/uL (ref 4.22–5.81)
RDW: 13.3 % (ref 11.5–15.5)
WBC: 7.4 10*3/uL (ref 4.0–10.5)

## 2015-05-05 LAB — DIFFERENTIAL
BASOS ABS: 0.1 10*3/uL (ref 0.0–0.1)
Basophils Relative: 1 % (ref 0–1)
EOS PCT: 2 % (ref 0–5)
Eosinophils Absolute: 0.2 10*3/uL (ref 0.0–0.7)
LYMPHS ABS: 1.9 10*3/uL (ref 0.7–4.0)
LYMPHS PCT: 26 % (ref 12–46)
MONO ABS: 0.5 10*3/uL (ref 0.1–1.0)
MONOS PCT: 7 % (ref 3–12)
NEUTROS ABS: 4.8 10*3/uL (ref 1.7–7.7)
NEUTROS PCT: 64 % (ref 43–77)

## 2015-05-05 LAB — ANTITHROMBIN III: AntiThromb III Func: 91 % (ref 75–120)

## 2015-05-05 LAB — PROTIME-INR
INR: 0.99 (ref 0.00–1.49)
Prothrombin Time: 13.3 seconds (ref 11.6–15.2)

## 2015-05-05 LAB — RAPID URINE DRUG SCREEN, HOSP PERFORMED
AMPHETAMINES: NOT DETECTED
BENZODIAZEPINES: NOT DETECTED
Barbiturates: NOT DETECTED
COCAINE: NOT DETECTED
OPIATES: POSITIVE — AB
Tetrahydrocannabinol: POSITIVE — AB

## 2015-05-05 LAB — TROPONIN I: Troponin I: <0.03 ng/mL (ref <0.031)

## 2015-05-05 LAB — ETHANOL: Alcohol, Ethyl (B): <5 mg/dL (ref <5)

## 2015-05-05 LAB — APTT: aPTT: 34 seconds (ref 24–37)

## 2015-05-05 MED ORDER — ASPIRIN 300 MG RE SUPP: 300.0000 mg | Freq: Every day | RECTAL | Status: DC

## 2015-05-05 MED ORDER — ENOXAPARIN SODIUM 40 MG/0.4ML ~~LOC~~ SOLN
40.0000 mg | SUBCUTANEOUS | Status: DC
  Administered 2015-05-05 – 2015-05-06 (×2): 40 mg via SUBCUTANEOUS
  Filled 2015-05-05 (×2): qty 0.4

## 2015-05-05 MED ORDER — ACETAMINOPHEN 325 MG PO TABS
650.0000 mg | ORAL_TABLET | ORAL | Status: DC | PRN
  Administered 2015-05-05: 650 mg via ORAL
  Filled 2015-05-05: qty 2

## 2015-05-05 MED ORDER — ASPIRIN EC 325 MG PO TBEC: 325.0000 mg | DELAYED_RELEASE_TABLET | Freq: Every day | ORAL | Status: DC

## 2015-05-05 MED ORDER — PNEUMOCOCCAL VAC POLYVALENT 25 MCG/0.5ML IJ INJ
0.5000 mL | INJECTION | INTRAMUSCULAR | Status: AC
  Administered 2015-05-06: 0.5 mL via INTRAMUSCULAR
  Filled 2015-05-05: qty 0.5

## 2015-05-05 MED ORDER — OXYCODONE HCL 5 MG PO TABS
5.0000 mg | ORAL_TABLET | ORAL | Status: DC | PRN
  Administered 2015-05-05 – 2015-05-07 (×9): 5 mg via ORAL
  Filled 2015-05-05 (×9): qty 1

## 2015-05-05 MED ORDER — LORAZEPAM 2 MG/ML IJ SOLN
1.0000 mg | Freq: Once | INTRAMUSCULAR | Status: AC
  Administered 2015-05-05: 1 mg via INTRAVENOUS
  Filled 2015-05-05: qty 1

## 2015-05-05 MED ORDER — SENNOSIDES-DOCUSATE SODIUM 8.6-50 MG PO TABS: 1.0000 | ORAL_TABLET | Freq: Every evening | ORAL | Status: DC | PRN

## 2015-05-05 MED ORDER — ASPIRIN EC 325 MG PO TBEC
325.0000 mg | DELAYED_RELEASE_TABLET | Freq: Once | ORAL | Status: AC
  Administered 2015-05-05: 325 mg via ORAL
  Filled 2015-05-05: qty 1

## 2015-05-05 MED ORDER — STROKE: EARLY STAGES OF RECOVERY BOOK
Freq: Once | Status: AC
  Administered 2015-05-05: 20:00:00
  Filled 2015-05-05: qty 1

## 2015-05-05 MED ORDER — ASPIRIN 325 MG PO TABS
325.0000 mg | ORAL_TABLET | Freq: Every day | ORAL | Status: DC
  Administered 2015-05-06 – 2015-05-07 (×2): 325 mg via ORAL
  Filled 2015-05-05 (×2): qty 1

## 2015-05-05 MED ORDER — NICOTINE 21 MG/24HR TD PT24
21.0000 mg | MEDICATED_PATCH | TRANSDERMAL | Status: DC
  Administered 2015-05-05 – 2015-05-07 (×3): 21 mg via TRANSDERMAL
  Filled 2015-05-05 (×4): qty 1

## 2015-05-05 MED ORDER — PANTOPRAZOLE SODIUM 40 MG PO TBEC
40.0000 mg | DELAYED_RELEASE_TABLET | Freq: Every day | ORAL | Status: DC
Start: 2015-05-05 — End: 2015-05-07
  Administered 2015-05-05 – 2015-05-07 (×3): 40 mg via ORAL
  Filled 2015-05-05 (×3): qty 1

## 2015-05-05 NOTE — ED Notes (Signed)
Family provided update on tx and assigned room at [facility]

## 2015-05-05 NOTE — H&P (Signed)
[facility] History and Physical                                                                                    Jonathan Novak, is a 44 y.o. male  MRN: [id number]   DOB - [birth year]  Admit Date - 05/05/2015  Outpatient Primary MD for the patient is No PCP Per Patient  Referring Physician:  Dr. Thomasene Lot  Chief Complaint:   Chief Complaint  Patient presents with  . Dysphagia     HPI  Jonathan Novak  is a 44 y.o. male, with tobacco abuse who presents to [facility] on 8/13 with right-sided facial droop, slurred speech and right upper extremity weakness. He states his symptoms started exactly 9:20 PM the evening before. He was saying goodbye to his neighbors and suddenly had difficulty speaking. He attempted to move his right arm and was unable to do so. Despite his symptoms he went home and went to bed. He awoke to go to the bathroom 1 AM this morning and realized he still had symptoms. At that point he went to [facility].  A CT scan of his head showed a small subacute nonhemorrhagic infarction in the left parietal lobe.  The patient says that he had been feeling well recently with no chest pain, shortness of breath, changes in bowel habits or dysuria. He did complain of nasal congestion, pain and swelling in his right arm, and heartburn. He reports that both his mother and father had strokes. His mother's stroke was at age 53 his father's's first stroke was in his early 52s.    Review of Systems   In addition to the HPI above,  No Fever-chills, No Headache, + slightly blurred vision No problems swallowing food or Liquids, No Chest pain, Cough or Shortness of Breath, No Abdominal pain, No Nausea or Vomiting, Bowel movements are regular, No Blood in stool or Urine, No dysuria, No new skin rashes or bruises, No new joints pains-aches,  No new weakness, tingling, numbness in any extremity, No recent weight gain or loss, A full 10 point Review of Systems was  done, except as stated above, all other Review of Systems were negative.  Past Medical History  History reviewed. No pertinent past medical history.  Past Surgical History  Procedure Laterality Date  . Patella fracture surgery Right       Social History Social History  Substance Use Topics  . Smoking status: Current Every Day Smoker  . Smokeless tobacco: Not on file  . 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  Family History Family History  Problem Relation Age of Onset  . Diabetes Mother   . Hypertension Mother   . Stroke Mother   . Stroke Father     Prior to Admission medications   Medication Sig Start Date End Date Taking? Authorizing Provider  acetaminophen (TYLENOL) 500 MG tablet Take 1,000 mg by mouth every 6 (six) hours as needed for moderate pain.   Yes Historical Provider, MD  ibuprofen (ADVIL,MOTRIN) 200 MG tablet Take 800-1,000 mg by mouth every 6 (  six) hours as needed for moderate pain.   Yes Historical Provider, MD    No Known Allergies  Physical Exam  Vitals  Blood pressure 129/74, pulse 51, temperature 97.5 F (36.4 C), temperature source Oral, resp. rate 16, height 6' (1.829 m), weight 108.863 kg (240 lb), SpO2 99 %.   General:  Well-developed well-nourished, male lying in bed in NAD, sister at bedside  Psych:  Normal affect and insight, Not Suicidal or Homicidal, Awake Alert, Oriented X 3. Slightly anxious  Neuro:  Mild right-sided patient droop, occasional slurred speech, slight right upper extremity weakness. Otherwise no focal neuro deficits  ENT:  Ears and Eyes appear Normal, Conjunctivae clear, PER reactive to light. Moist oral mucosa without erythema or exudates.  Neck:  Supple, No lymphadenopathy appreciated, no carotid bruits heard  Respiratory:  Symmetrical chest wall movement, Good air movement bilaterally, CTAB.  Cardiac:  RRR, No Murmurs, no LE edema noted,  no JVD.    Abdomen:  Positive bowel sounds, Soft, Non tender, Non distended,  No masses appreciated  Skin: Tanned, No Cyanosis, Normal Skin Turgor, No Skin Rash or Bruise. Multiple tattoos.  Extremities:  Able to move all 4. 5/5 strength in each,  no effusions.  Data Review  CBC  Recent Labs Lab 05/05/15 0955  WBC 7.4  HGB 16.0  HCT 46.0  PLT 257  MCV 80.8  MCH 28.1  MCHC 34.8  RDW 13.3  LYMPHSABS 1.9  MONOABS 0.5  EOSABS 0.2  BASOSABS 0.1    Chemistries   Recent Labs Lab 05/05/15 0955  NA 138  K 4.1  CL 107  CO2 23  GLUCOSE 119*  BUN 14  CREATININE 0.89  CALCIUM 9.7  AST 31  ALT 38  ALKPHOS 59  BILITOT 0.7    Coagulation profile  Recent Labs Lab 05/05/15 0955  INR 0.99     Cardiac Enzymes  Recent Labs Lab 05/05/15 0955  TROPONINI <0.03     Urinalysis    Component Value Date/Time   COLORURINE YELLOW 05/05/2015 1019   APPEARANCEUR CLEAR 05/05/2015 1019   LABSPEC 1.015 05/05/2015 1019   PHURINE 7.0 05/05/2015 1019   GLUCOSEU NEGATIVE 05/05/2015 1019   HGBUR NEGATIVE 05/05/2015 1019   BILIRUBINUR NEGATIVE 05/05/2015 1019   KETONESUR NEGATIVE 05/05/2015 1019   PROTEINUR NEGATIVE 05/05/2015 1019   UROBILINOGEN 0.2 05/05/2015 1019   NITRITE NEGATIVE 05/05/2015 1019   LEUKOCYTESUR NEGATIVE 05/05/2015 1019    Imaging results:   Ct Head Wo Contrast  05/05/2015   CLINICAL DATA:  Right arm weakness and heaviness. Slurred speech since last night.  EXAM: CT HEAD WITHOUT CONTRAST  TECHNIQUE: Contiguous axial images were obtained from the base of the skull through the vertex without intravenous contrast.  COMPARISON:  None.  FINDINGS: There is a subtle area of cortical lucency in the anterior aspect of the left frontal lobe on image 22 of series 2 which could represent a small nonhemorrhagic infarct. There is no acute intracranial hemorrhage or mass lesion. Brain parenchyma is otherwise normal. Osseous structures are normal.  IMPRESSION: Subtle  area of cortical lucency in the anterior left parietal lobe which could represent a small area of subacute nonhemorrhagic infarction.   Electronically Signed   By: Lorriane Shire M.D.   On: 05/05/2015 08:48    My personal review of EKG: First-degree AV block, no QT prolongation   Assessment & Plan  Principal Problem:   Stroke Active Problems:   Tobacco abuse   Marijuana use  Small  left parietal infarct Admitted under stroke order set. Serologies, 2-D echo, carotids, MRI/MRA, PT/OT/ST pending Given his age will check a hypercoagulable panel. If positive he may need anticoagulation. Placed on 325 mg aspirin.    Tobacco abuse Counseled cessation. Patient is willing to quit. Nicotine patch ordered.   Patient requests that we do not speak with his wife about his tobacco or marijuana use.   Consultants Called:  Dr. Nicole Kindred, neurology  Family Communication:   Sister at bedside  Code Status:  Full code  Condition:  Stable guarded  Potential Disposition: To home in 24 hours if workup permits.  Time spent in minutes : [address],  PA-C on 05/05/2015 at 4:07 PM Between 7am to 7pm - Pager - [phone] After 7pm go to [email or web address] - password [email or web address] And look for the night coverage person covering me after hours  [facility]

## 2015-05-05 NOTE — ED Notes (Signed)
Family at bedside. 

## 2015-05-05 NOTE — ED Notes (Signed)
Pt resting quietly, closing eyes at times, FAST remains negative, pt mae x 4.

## 2015-05-05 NOTE — ED Notes (Signed)
FAST: negative

## 2015-05-05 NOTE — ED Notes (Signed)
Pt very upset with CT results as explained by EDP, emotional support provided

## 2015-05-05 NOTE — ED Provider Notes (Signed)
CSN: [id number]     Arrival date & time 05/05/15  0750 History   First MD Initiated Contact with Patient 05/05/15 0800     Chief Complaint  Patient presents with  . Dysphagia     (Consider location/radiation/quality/duration/timing/severity/associated sxs/prior Treatment) HPI   Patient is a 44 year old male presenting today with multiple symptoms. Patient states that for the last 2 months he's been waking up with right hand numbness and trouble moving his fingers, trouble with his grip. Patient reports that last night at 9 PM he started to have some difficulty with slurring speech. His wife googled these complaints and he felt like he was having a stroke. He waited at home and hoped to get better this morning. Went to o work this am and then decided to go home, change clothes and then come here to be checked out.  Patient has no history of hypertension hyperlipidemia or diabetes. Patient occasionally smokes cigarettes.  Patient has an appointment with a PCP in 2 weeks.    History reviewed. No pertinent past medical history. Past Surgical History  Procedure Laterality Date  . Patella fracture surgery Right    Family History  Problem Relation Age of Onset  . Diabetes Mother   . Hypertension Mother    Social History  Substance Use Topics  . Smoking status: Current Every Day Smoker  . Smokeless tobacco: None  . Alcohol Use: Yes    Review of Systems  Constitutional: Negative for fever and activity change.  HENT: Negative for drooling and hearing loss.   Eyes: Negative for discharge and redness.  Respiratory: Negative for cough and shortness of breath.   Cardiovascular: Negative for chest pain.  Gastrointestinal: Negative for abdominal pain.  Genitourinary: Negative for dysuria and urgency.  Allergic/Immunologic: Negative for immunocompromised state.  Neurological: Negative for seizures and speech difficulty.  Psychiatric/Behavioral: Negative for behavioral problems and  agitation.  All other systems reviewed and are negative.     Allergies  Review of patient's allergies indicates no known allergies.  Home Medications   Prior to Admission medications   Medication Sig Start Date End Date Taking? Authorizing Provider  amoxicillin-clavulanate (AUGMENTIN) 875-125 MG per tablet Take 1 tablet by mouth 2 (two) times daily. 09/03/14   Tanna Furry, MD  cyclobenzaprine (FLEXERIL) 10 MG tablet Take 10 mg by mouth every 8 (eight) hours as needed for muscle spasms.    Historical Provider, MD  doxycycline (DORYX) 100 MG DR capsule Take 100 mg by mouth 2 (two) times daily.    Historical Provider, MD  ranitidine (ZANTAC) 150 MG tablet Take 150 mg by mouth daily.    Historical Provider, MD  traMADol (ULTRAM) 50 MG tablet Take by mouth every 12 (twelve) hours as needed.    Historical Provider, MD   BP 152/99 mmHg  Pulse 63  Temp(Src) 97.5 F (36.4 C) (Oral)  Resp 18  Ht 6' (1.829 m)  Wt 240 lb (108.863 kg)  BMI 32.54 kg/m2  SpO2 100% Physical Exam  Constitutional: He is oriented to person, place, and time. He appears well-nourished.  HENT:  Head: Normocephalic.  Mouth/Throat: Oropharynx is clear and moist.  Eyes: Conjunctivae are normal.  Neck: No tracheal deviation present.  Cardiovascular: Normal rate.   Pulmonary/Chest: Effort normal. No stridor. No respiratory distress.  Abdominal: Soft. There is no tenderness. There is no guarding.  Musculoskeletal: Normal range of motion. He exhibits no edema.  Neurological: He is oriented to person, place, and time. No cranial nerve deficit.  Cranial  nerves II through XII intact. No facial asymmetry. Tongue midline.  Patient has mildly weaker grip strength on the right. However equal strength with biceps and triceps. Able to manipulate objects. With the right hand.  Patient has steady gait. Equal sensation bilateral upper and lower extremities.  Patient has no slurred speech.  Skin: Skin is warm and dry. No rash  noted. He is not diaphoretic.  Psychiatric: Thought content normal.  Nursing note and vitals reviewed.   ED Course  Procedures (including critical care time) Labs Review Labs Reviewed  CBC  DIFFERENTIAL  ETHANOL  PROTIME-INR  APTT  COMPREHENSIVE METABOLIC PANEL  URINE RAPID DRUG SCREEN, HOSP PERFORMED  URINALYSIS, ROUTINE W REFLEX MICROSCOPIC (NOT AT [facility])  LIPID PANEL  TROPONIN I    Imaging Review Ct Head Wo Contrast  05/05/2015   CLINICAL DATA:  Right arm weakness and heaviness. Slurred speech since last night.  EXAM: CT HEAD WITHOUT CONTRAST  TECHNIQUE: Contiguous axial images were obtained from the base of the skull through the vertex without intravenous contrast.  COMPARISON:  None.  FINDINGS: There is a subtle area of cortical lucency in the anterior aspect of the left frontal lobe on image 22 of series 2 which could represent a small nonhemorrhagic infarct. There is no acute intracranial hemorrhage or mass lesion. Brain parenchyma is otherwise normal. Osseous structures are normal.  IMPRESSION: Subtle area of cortical lucency in the anterior left parietal lobe which could represent a small area of subacute nonhemorrhagic infarction.   Electronically Signed   By: Lorriane Shire M.D.   On: 05/05/2015 08:48   I, [place], personally reviewed and evaluated these images and lab results as part of my medical decision-making.   EKG Interpretation   Date/Time:  Saturday May 05 2015 08:20:10 EDT Ventricular Rate:  64 PR Interval:  216 QRS Duration: 102 QT Interval:  408 QTC Calculation: 420 R Axis:   -11 Text Interpretation:  Sinus rhythm with 1st degree A-V block Otherwise  normal ECG no evidence of acute ischmeia.  Confirmed by Gerald Leitz  [phone]) on 05/05/2015 8:44:47 AM      MDM   Final diagnoses:  Stroke    Patient is a 44 year old male presenting today with multiple complaints. He felt like he developed slurred speech last night at 9 PM. In  addition he's had 2 months of right hand weakness and numbness. Patient adn wife googled symptoms and he is convinced that he is having a stroke.  On exam patient has no slurred speech. His cranial nerves II through XII are intact. He's got equal strength bilateral upper extremities (except for grip strength in the hand,)  NIH stroke scale  0. In addition patient has no stroke risk factors . He is young, no history of hypertension, no history of hyperlipidemia, no history of diabetes, occasional smoker. Although patient does not noramlly see physicians.  Objectively patient has no neurological deficits. Is difficult because Patient is stating that he is having slurred speech, however I do not observe any slurred speech. (nor do the nurses)  It is diffiuclt  to rule  out a stroke without an MRI, however given the lack of concrete symptoms is difficult to justify transfer for MRI at this time. We will start with the CT. CT may show evidence of stroke that occurred last night.  10:15 AM CT showed evidence of stroke. [facility] consult neurology and transfer to [facility] for further workup.  Chasity Outten Julio Alm, MD 05/05/15 1015

## 2015-05-05 NOTE — Consult Note (Signed)
Admission H&P    Chief Complaint: Acute onset of slurred speech, right facial weakness and clumsiness of right hand.  HPI: Jonathan Novak is an 44 y.o. male with no documented systemic medical illness, presenting following acute onset of clumsiness of his right hand as well as slurred speech and right facial droop. Patient also noticed coordination difficulty involving his right leg. He has not been on antiplatelets therapy. CT scan of his head showed an area of cortical lucency involving the anterior left parietal region, suspicious for acute cerebral infarction. NIH stroke score was 3.  LSN: 9:30 PM on 05/04/2015 tPA Given: No: Beyond time window for treatment consideration mRankin:  History reviewed. No pertinent past medical history.  Past Surgical History  Procedure Laterality Date  . Patella fracture surgery Right     Family History  Problem Relation Age of Onset  . Diabetes Mother   . Hypertension Mother   Also positive for stroke involving both parents  Social History:  reports that he has been smoking.  He does not have any smokeless tobacco history on file. He reports that he drinks alcohol. He reports that he does not use illicit drugs.  Allergies: No Known Allergies  Medications Prior to Admission  Medication Sig Dispense Refill  . acetaminophen (TYLENOL) 500 MG tablet Take 1,000 mg by mouth every 6 (six) hours as needed for moderate pain.    Marland Kitchen ibuprofen (ADVIL,MOTRIN) 200 MG tablet Take 800-1,000 mg by mouth every 6 (six) hours as needed for moderate pain.    . [DISCONTINUED] amoxicillin-clavulanate (AUGMENTIN) 875-125 MG per tablet Take 1 tablet by mouth 2 (two) times daily. 20 tablet 0    ROS: History obtained from spouse and the patient  General ROS: negative for - chills, fatigue, fever, night sweats, weight gain or weight loss Psychological ROS: negative for - behavioral disorder, hallucinations, memory difficulties, mood swings or suicidal  ideation Ophthalmic ROS: negative for - blurry vision, double vision, eye pain or loss of vision ENT ROS: negative for - epistaxis, nasal discharge, oral lesions, sore throat, tinnitus or vertigo Allergy and Immunology ROS: negative for - hives or itchy/watery eyes Hematological and Lymphatic ROS: negative for - bleeding problems, bruising or swollen lymph nodes Endocrine ROS: negative for - galactorrhea, hair pattern changes, polydipsia/polyuria or temperature intolerance Respiratory ROS: negative for - cough, hemoptysis, shortness of breath or wheezing Cardiovascular ROS: negative for - chest pain, dyspnea on exertion, edema or irregular heartbeat Gastrointestinal ROS: negative for - abdominal pain, diarrhea, hematemesis, nausea/vomiting or stool incontinence Genito-Urinary ROS: negative for - dysuria, hematuria, incontinence or urinary frequency/urgency Musculoskeletal ROS: negative for - joint swelling or muscular weakness Neurological ROS: as noted in HPI Dermatological ROS: negative for rash and skin lesion changes  Physical Examination: Blood pressure 158/97, pulse 54, temperature 97.5 F (36.4 C), temperature source Oral, resp. rate 16, height 6' (1.829 m), weight 108.863 kg (240 lb), SpO2 98 %.  HEENT-  Normocephalic, no lesions, without obvious abnormality.  Normal external eye and conjunctiva.  Normal TM's bilaterally.  Normal auditory canals and external ears. Normal external nose, mucus membranes and septum.  Normal pharynx. Neck supple with no masses, nodes, nodules or enlargement. Cardiovascular - regular rate and rhythm, S1, S2 normal, no murmur, click, rub or gallop Lungs - chest clear, no wheezing, rales, normal symmetric air entry Abdomen - soft, non-tender; bowel sounds normal; no masses,  no organomegaly Extremities - no edema; deformity of distal right leg from previous trauma and surgery.  Neurologic Examination:  Mental Status: Alert, oriented, thought content  appropriate.  Speech slightly slurred without evidence of aphasia. Able to follow commands without difficulty. Cranial Nerves: II-Visual fields were normal. III/IV/VI-Pupils were equal and reacted normally to light. Extraocular movements were full and conjugate.    V/VII-no facial numbness; mild right lower facial weakness. VIII-normal. X-mild dysarthria; symmetrical palatal movement. XI: trapezius strength/neck flexion strength normal bilaterally XII-midline tongue extension with normal strength. Motor: 5/5 bilaterally with normal tone and bulk Sensory: Normal throughout. Deep Tendon Reflexes: 2+ and symmetric. Plantars: Mute bilaterally Cerebellar: Normal finger-to-nose testing. Carotid auscultation: Normal  Results for orders placed or performed during the hospital encounter of 05/05/15 (from the past 48 hour(s))  Ethanol     Status: None   Collection Time: 05/05/15  9:55 AM  Result Value Ref Range   Alcohol, Ethyl (B) <5 <5 mg/dL    Comment:        LOWEST DETECTABLE LIMIT FOR SERUM ALCOHOL IS 5 mg/dL FOR MEDICAL PURPOSES ONLY   Protime-INR     Status: None   Collection Time: 05/05/15  9:55 AM  Result Value Ref Range   Prothrombin Time 13.3 11.6 - 15.2 seconds   INR 0.99 0.00 - 1.49  APTT     Status: None   Collection Time: 05/05/15  9:55 AM  Result Value Ref Range   aPTT 34 24 - 37 seconds  CBC     Status: None   Collection Time: 05/05/15  9:55 AM  Result Value Ref Range   WBC 7.4 4.0 - 10.5 K/uL   RBC 5.69 4.22 - 5.81 MIL/uL   Hemoglobin 16.0 13.0 - 17.0 g/dL   HCT 46.0 39.0 - 52.0 %   MCV 80.8 78.0 - 100.0 fL   MCH 28.1 26.0 - 34.0 pg   MCHC 34.8 30.0 - 36.0 g/dL   RDW 13.3 11.5 - 15.5 %   Platelets 257 150 - 400 K/uL  Differential     Status: None   Collection Time: 05/05/15  9:55 AM  Result Value Ref Range   Neutrophils Relative % 64 43 - 77 %   Neutro Abs 4.8 1.7 - 7.7 K/uL   Lymphocytes Relative 26 12 - 46 %   Lymphs Abs 1.9 0.7 - 4.0 K/uL   Monocytes  Relative 7 3 - 12 %   Monocytes Absolute 0.5 0.1 - 1.0 K/uL   Eosinophils Relative 2 0 - 5 %   Eosinophils Absolute 0.2 0.0 - 0.7 K/uL   Basophils Relative 1 0 - 1 %   Basophils Absolute 0.1 0.0 - 0.1 K/uL  Comprehensive metabolic panel     Status: Abnormal   Collection Time: 05/05/15  9:55 AM  Result Value Ref Range   Sodium 138 135 - 145 mmol/L   Potassium 4.1 3.5 - 5.1 mmol/L   Chloride 107 101 - 111 mmol/L   CO2 23 22 - 32 mmol/L   Glucose, Bld 119 (H) 65 - 99 mg/dL   BUN 14 6 - 20 mg/dL   Creatinine, Ser 0.89 0.61 - 1.24 mg/dL   Calcium 9.7 8.9 - 10.3 mg/dL   Total Protein 6.7 6.5 - 8.1 g/dL   Albumin 4.3 3.5 - 5.0 g/dL   AST 31 15 - 41 U/L   ALT 38 17 - 63 U/L   Alkaline Phosphatase 59 38 - 126 U/L   Total Bilirubin 0.7 0.3 - 1.2 mg/dL   GFR calc non Af Amer >60 >60 mL/min   GFR calc Af Amer >  60 >60 mL/min    Comment: (NOTE) The eGFR has been calculated using the CKD EPI equation. This calculation has not been validated in all clinical situations. eGFR's persistently <60 mL/min signify possible Chronic Kidney Disease.    Anion gap 8 5 - 15  Troponin I     Status: None   Collection Time: 05/05/15  9:55 AM  Result Value Ref Range   Troponin I <0.03 <0.031 ng/mL    Comment:        NO INDICATION OF MYOCARDIAL INJURY.   Urine rapid drug screen (hosp performed)not at [facility]     Status: Abnormal   Collection Time: 05/05/15 10:19 AM  Result Value Ref Range   Opiates POSITIVE (A) NONE DETECTED   Cocaine NONE DETECTED NONE DETECTED   Benzodiazepines NONE DETECTED NONE DETECTED   Amphetamines NONE DETECTED NONE DETECTED   Tetrahydrocannabinol POSITIVE (A) NONE DETECTED   Barbiturates NONE DETECTED NONE DETECTED    Comment:        DRUG SCREEN FOR MEDICAL PURPOSES ONLY.  IF CONFIRMATION IS NEEDED FOR ANY PURPOSE, NOTIFY LAB WITHIN 5 DAYS.        LOWEST DETECTABLE LIMITS FOR URINE DRUG SCREEN Drug Class       Cutoff (ng/mL) Amphetamine      1000 Barbiturate       200 Benzodiazepine   465 Tricyclics       681 Opiates          300 Cocaine          300 THC              50   Urinalysis, Routine w reflex microscopic (not at [facility])     Status: None   Collection Time: 05/05/15 10:19 AM  Result Value Ref Range   Color, Urine YELLOW YELLOW   APPearance CLEAR CLEAR   Specific Gravity, Urine 1.015 1.005 - 1.030   pH 7.0 5.0 - 8.0   Glucose, UA NEGATIVE NEGATIVE mg/dL   Hgb urine dipstick NEGATIVE NEGATIVE   Bilirubin Urine NEGATIVE NEGATIVE   Ketones, ur NEGATIVE NEGATIVE mg/dL   Protein, ur NEGATIVE NEGATIVE mg/dL   Urobilinogen, UA 0.2 0.0 - 1.0 mg/dL   Nitrite NEGATIVE NEGATIVE   Leukocytes, UA NEGATIVE NEGATIVE    Comment: MICROSCOPIC NOT DONE ON URINES WITH NEGATIVE PROTEIN, BLOOD, LEUKOCYTES, NITRITE, OR GLUCOSE <1000 mg/dL.   Ct Head Wo Contrast  05/05/2015   CLINICAL DATA:  Right arm weakness and heaviness. Slurred speech since last night.  EXAM: CT HEAD WITHOUT CONTRAST  TECHNIQUE: Contiguous axial images were obtained from the base of the skull through the vertex without intravenous contrast.  COMPARISON:  None.  FINDINGS: There is a subtle area of cortical lucency in the anterior aspect of the left frontal lobe on image 22 of series 2 which could represent a small nonhemorrhagic infarct. There is no acute intracranial hemorrhage or mass lesion. Brain parenchyma is otherwise normal. Osseous structures are normal.  IMPRESSION: Subtle area of cortical lucency in the anterior left parietal lobe which could represent a small area of subacute nonhemorrhagic infarction.   Electronically Signed   By: Lorriane Shire M.D.   On: 05/05/2015 08:48    Assessment: 44 y.o. 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  Stroke Risk Factors - smoking and family history  Plan: 1. HgbA1c, fasting lipid panel 2. MRI, MRA  of the brain without contrast 3. PT consult, OT consult, Speech consult 4. Echocardiogram 5. Carotid dopplers  6.  Prophylactic therapy-Antiplatelet med: Aspirin  7. Risk factor modification 8. Hypercoagulation panel  C.R. Nicole Kindred, MD [facility] [phone]  05/05/2015, 3:16 PM

## 2015-05-05 NOTE — ED Notes (Signed)
Pt states has difficulty utilizing rt arm, had slurred speech last pm as well

## 2015-05-05 NOTE — ED Notes (Signed)
Presents with difficulty swallowing since 2100hrs last PM, states has for the past month has had rt arm feeling heavy

## 2015-05-05 NOTE — ED Notes (Signed)
Phone report given to [employer]

## 2015-05-05 NOTE — ED Notes (Signed)
Pt placed on cont cardiac monitor with cont POX monitoring

## 2015-05-05 NOTE — ED Notes (Signed)
MD at bedside. 

## 2015-05-05 NOTE — ED Notes (Signed)
Pt states he feels at times being disoriented, feeling "goofy", states at times having slurred speach

## 2015-05-05 NOTE — ED Notes (Signed)
NIH by EDP

## 2015-05-06 ENCOUNTER — Observation Stay (HOSPITAL_COMMUNITY): Payer: BC Managed Care – PPO

## 2015-05-06 DIAGNOSIS — R471 Dysarthria and anarthria: Secondary | ICD-10-CM | POA: Diagnosis present

## 2015-05-06 DIAGNOSIS — I639 Cerebral infarction, unspecified: Secondary | ICD-10-CM | POA: Diagnosis not present

## 2015-05-06 DIAGNOSIS — I6789 Other cerebrovascular disease: Secondary | ICD-10-CM

## 2015-05-06 DIAGNOSIS — G4733 Obstructive sleep apnea (adult) (pediatric): Secondary | ICD-10-CM | POA: Diagnosis present

## 2015-05-06 DIAGNOSIS — Z23 Encounter for immunization: Secondary | ICD-10-CM | POA: Diagnosis not present

## 2015-05-06 DIAGNOSIS — R269 Unspecified abnormalities of gait and mobility: Secondary | ICD-10-CM | POA: Diagnosis present

## 2015-05-06 DIAGNOSIS — F1290 Cannabis use, unspecified, uncomplicated: Secondary | ICD-10-CM | POA: Diagnosis present

## 2015-05-06 DIAGNOSIS — E785 Hyperlipidemia, unspecified: Secondary | ICD-10-CM | POA: Diagnosis present

## 2015-05-06 DIAGNOSIS — R29810 Facial weakness: Secondary | ICD-10-CM | POA: Diagnosis present

## 2015-05-06 DIAGNOSIS — R12 Heartburn: Secondary | ICD-10-CM | POA: Diagnosis present

## 2015-05-06 DIAGNOSIS — Z6832 Body mass index (BMI) 32.0-32.9, adult: Secondary | ICD-10-CM | POA: Diagnosis not present

## 2015-05-06 DIAGNOSIS — I495 Sick sinus syndrome: Secondary | ICD-10-CM | POA: Diagnosis not present

## 2015-05-06 DIAGNOSIS — R1310 Dysphagia, unspecified: Secondary | ICD-10-CM | POA: Diagnosis present

## 2015-05-06 DIAGNOSIS — I440 Atrioventricular block, first degree: Secondary | ICD-10-CM | POA: Diagnosis present

## 2015-05-06 DIAGNOSIS — F17210 Nicotine dependence, cigarettes, uncomplicated: Secondary | ICD-10-CM | POA: Diagnosis present

## 2015-05-06 DIAGNOSIS — M79601 Pain in right arm: Secondary | ICD-10-CM | POA: Diagnosis present

## 2015-05-06 DIAGNOSIS — E669 Obesity, unspecified: Secondary | ICD-10-CM | POA: Diagnosis present

## 2015-05-06 DIAGNOSIS — F1210 Cannabis abuse, uncomplicated: Secondary | ICD-10-CM | POA: Diagnosis not present

## 2015-05-06 DIAGNOSIS — Z823 Family history of stroke: Secondary | ICD-10-CM | POA: Diagnosis not present

## 2015-05-06 DIAGNOSIS — Z720 Tobacco use: Secondary | ICD-10-CM | POA: Diagnosis not present

## 2015-05-06 DIAGNOSIS — R0981 Nasal congestion: Secondary | ICD-10-CM | POA: Diagnosis present

## 2015-05-06 DIAGNOSIS — I6350 Cerebral infarction due to unspecified occlusion or stenosis of unspecified cerebral artery: Secondary | ICD-10-CM | POA: Diagnosis not present

## 2015-05-06 DIAGNOSIS — I10 Essential (primary) hypertension: Secondary | ICD-10-CM | POA: Diagnosis not present

## 2015-05-06 LAB — LIPID PANEL
CHOL/HDL RATIO: 8.2 ratio
CHOLESTEROL: 239 mg/dL — AB (ref 0–200)
HDL: 29 mg/dL — AB (ref >40)
LDL Cholesterol: 143 mg/dL — ABNORMAL HIGH (ref 0–99)
Triglycerides: 337 mg/dL — ABNORMAL HIGH (ref <150)
VLDL: 67 mg/dL — ABNORMAL HIGH (ref 0–40)

## 2015-05-06 MED ORDER — ATORVASTATIN CALCIUM 80 MG PO TABS
80.0000 mg | ORAL_TABLET | Freq: Every day | ORAL | Status: DC
  Administered 2015-05-06: 80 mg via ORAL
  Filled 2015-05-06: qty 1

## 2015-05-06 MED ORDER — ATORVASTATIN CALCIUM 40 MG PO TABS: 40.0000 mg | ORAL_TABLET | Freq: Every day | ORAL | Status: DC

## 2015-05-06 NOTE — Evaluation (Signed)
Physical Therapy Evaluation Patient Details Name: Jonathan Novak MRN: [id number] DOB: 01-21-1971 Today's Date: 05/06/2015   History of Present Illness  44 y.o. male with no documented systemic medical illness, presenting following acute onset of clumsiness of his right hand as well as slurred speech and right facial droop. Patient also noticed coordination difficulty involving his right leg. He has not been on antiplatelets therapy.Patient found to have Acute left basal ganglia infarct. NIH stroke score was 3.  Clinical Impression  Patient seen for mobility evaluation, mobilizing well. Performed ambulation, high level balance tasks, DGI and stair negotiation without difficulty. No further acute PT needs. Feel patient will be safe for d/c home. Will sign off.    Follow Up Recommendations No PT follow up    Equipment Recommendations  None recommended by PT    Recommendations for Other Services       Precautions / Restrictions        Mobility  Bed Mobility Overal bed mobility: Independent                Transfers Overall transfer level: Independent                  Ambulation/Gait Ambulation/Gait assistance: Independent Ambulation Distance (Feet): 600 Feet Assistive device: None Gait Pattern/deviations: WFL(Within Functional Limits) Gait velocity: WFL Gait velocity interpretation: at or above normal speed for age/gender General Gait Details: steady with ambulation  Stairs Stairs: Yes Stairs assistance: Independent Stair Management: Forwards;No rails Number of Stairs: 6 General stair comments: no assist, no rails, good speed  Wheelchair Mobility    Modified Rankin (Stroke Patients Only) Modified Rankin (Stroke Patients Only) Pre-Morbid Rankin Score: No symptoms Modified Rankin: No symptoms     Balance Overall balance assessment: No apparent balance deficits (not formally assessed)                               Standardized Balance  Assessment Standardized Balance Assessment : Dynamic Gait Index   Dynamic Gait Index Level Surface: Normal Change in Gait Speed: Normal Gait with Horizontal Head Turns: Normal Gait with Vertical Head Turns: Normal Gait and Pivot Turn: Normal Step Over Obstacle: Normal Step Around Obstacles: Normal Steps: Normal Total Score: 24       Pertinent Vitals/Pain Pain Assessment: No/denies pain    Home Living Family/patient expects to be discharged to:: Private residence Living Arrangements: Spouse/significant other Available Help at Discharge: Family Type of Home: House Home Access: Stairs to enter Entrance Stairs-Rails: Right Entrance Stairs-Number of Steps: 5   Home Equipment: None      Prior Function Level of Independence: Independent               Hand Dominance   Dominant Hand: Right    Extremity/Trunk Assessment   Upper Extremity Assessment: Overall WFL for tasks assessed           Lower Extremity Assessment: Overall WFL for tasks assessed (RLE deficit in ankle ROM at baseline)         Communication   Communication: Expressive difficulties (pt reports difficulty getting words out at times)  Cognition Arousal/Alertness: Awake/alert Behavior During Therapy: WFL for tasks assessed/performed Overall Cognitive Status: Within Functional Limits for tasks assessed                      General Comments General comments (skin integrity, edema, etc.): HR elevated to 130s with activity    Exercises  Assessment/Plan    PT Assessment Patent does not need any further PT services  PT Diagnosis Abnormality of gait   PT Problem List    PT Treatment Interventions     PT Goals (Current goals can be found in the Care Plan section) Acute Rehab PT Goals Patient Stated Goal: to go home PT Goal Formulation: All assessment and education complete, DC therapy    Frequency     Barriers to discharge        Co-evaluation                End of Session   Activity Tolerance: Patient tolerated treatment well Patient left: in bed Nurse Communication: Mobility status    Functional Assessment Tool Used: clinical judgement and DGI Functional Limitation: Mobility: Walking and moving around Mobility: Walking and Moving Around Current Status [phone]): 0 percent impaired, limited or restricted Mobility: Walking and Moving Around Goal Status [phone]): 0 percent impaired, limited or restricted Mobility: Walking and Moving Around Discharge Status [phone]): 0 percent impaired, limited or restricted    Time: 4332-9518 PT Time Calculation (min) (ACUTE ONLY): 14 min   Charges:   PT Evaluation $Initial PT Evaluation Tier I: 1 Procedure     PT G Codes:   PT G-Codes **NOT FOR INPATIENT CLASS** Functional Assessment Tool Used: clinical judgement and DGI Functional Limitation: Mobility: Walking and moving around Mobility: Walking and Moving Around Current Status (A4166): 0 percent impaired, limited or restricted Mobility: Walking and Moving Around Goal Status (A6301): 0 percent impaired, limited or restricted Mobility: Walking and Moving Around Discharge Status [phone]): 0 percent impaired, limited or restricted    Duncan Dull 05/06/2015, 9:17 AM Alben Deeds, PT DPT  [phone]

## 2015-05-06 NOTE — Progress Notes (Signed)
OT Cancellation Note  Patient Details Name: Jarquez Mestre MRN: [id number] DOB: 11/14/1970   Cancelled Treatment:    Reason Eval/Treat Not Completed: OT screened, no needs identified, will sign off  [place], Thereasa Parkin 05/06/2015, 9:43 AM

## 2015-05-06 NOTE — Progress Notes (Signed)
[facility] PROGRESS NOTE  Jonathan Novak GNO:[id number] DOB: 1971-06-21 DOA: 05/05/2015 PCP: No PCP Per Patient  Assessment/Plan:  Principal Problem:   Acute ischemic Stroke:  Workup underway.  LDL 143. Needs statin. Counseled to quit smoking. Echo without source of embolus. Carotid dopplers pending. ASA Active Problems:   Tobacco abuse: patch   Marijuana use  HPI/Subjective: Right hand clumsy. Speech back to normal  Objective: Filed Vitals:   05/06/15 0951  BP: 145/95  Pulse: 62  Temp: 98 F (36.7 C)  Resp: 20   No intake or output data in the 24 hours ending 05/06/15 1253 Filed Weights   05/05/15 0753  Weight: 108.863 kg (240 lb)    Exam:   General:  A and o  Cardiovascular: RRR  Respiratory: CTA without WRR  Abdomen: S, NT, ND  Ext: no CCE  Neuro: speech cleer and fluent. Motor normal. Normal finger to nose. Sensation intact  Basic Metabolic Panel:  Recent Labs Lab 05/05/15 0955  NA 138  K 4.1  CL 107  CO2 23  GLUCOSE 119*  BUN 14  CREATININE 0.89  CALCIUM 9.7   Liver Function Tests:  Recent Labs Lab 05/05/15 0955  AST 31  ALT 38  ALKPHOS 59  BILITOT 0.7  PROT 6.7  ALBUMIN 4.3   No results for input(s): LIPASE, AMYLASE in the last 168 hours. No results for input(s): AMMONIA in the last 168 hours. CBC:  Recent Labs Lab 05/05/15 0955  WBC 7.4  NEUTROABS 4.8  HGB 16.0  HCT 46.0  MCV 80.8  PLT 257   Cardiac Enzymes:  Recent Labs Lab 05/05/15 0955  TROPONINI <0.03   BNP (last 3 results) No results for input(s): BNP in the last 8760 hours.  ProBNP (last 3 results) No results for input(s): PROBNP in the last 8760 hours.  CBG: No results for input(s): GLUCAP in the last 168 hours.  No results found for this or any previous visit (from the past 240 hour(s)).   Studies: Dg Chest 2 View  05/05/2015   CLINICAL DATA:  Stroke.  Smoking history.  EXAM: CHEST - 2 VIEW  COMPARISON:  None.  FINDINGS: The heart size  and pulmonary interstitium ir exaggerated by low lung volumes. No focal airspace disease is present. There is no edema or effusion to suggest failure.  IMPRESSION: 1. Low lung volumes. 2. No acute cardiopulmonary disease.   Electronically Signed   By: San Morelle M.D.   On: 05/05/2015 17:47   Ct Head Wo Contrast  05/05/2015   CLINICAL DATA:  Right arm weakness and heaviness. Slurred speech since last night.  EXAM: CT HEAD WITHOUT CONTRAST  TECHNIQUE: Contiguous axial images were obtained from the base of the skull through the vertex without intravenous contrast.  COMPARISON:  None.  FINDINGS: There is a subtle area of cortical lucency in the anterior aspect of the left frontal lobe on image 22 of series 2 which could represent a small nonhemorrhagic infarct. There is no acute intracranial hemorrhage or mass lesion. Brain parenchyma is otherwise normal. Osseous structures are normal.  IMPRESSION: Subtle area of cortical lucency in the anterior left parietal lobe which could represent a small area of subacute nonhemorrhagic infarction.   Electronically Signed   By: Lorriane Shire M.D.   On: 05/05/2015 08:48   Mr Brain Wo Contrast  05/05/2015   CLINICAL DATA:  Slurred speech, right facial droop, and right hand clumsiness.  EXAM: MRI HEAD WITHOUT CONTRAST  MRA HEAD WITHOUT CONTRAST  TECHNIQUE: Multiplanar, multiecho pulse sequences of the brain and surrounding structures were obtained without intravenous contrast. Angiographic images of the head were obtained using MRA technique without contrast.  COMPARISON:  Head CT 05/05/2015  FINDINGS: MRI HEAD FINDINGS  There is a small acute left lateral lenticulostriate territory infarct extending from the posterior left lentiform nucleus into the posterior body of the caudate. No abnormality is seen in the left frontal lobe to correspond to the questioned infarct in this region on the head CT, and this was likely artifactual. There is no evidence of intracranial  hemorrhage, mass, midline shift, or extra-axial fluid collection. Ventricles and sulci are normal.  Orbits are unremarkable. Paranasal sinuses and mastoid air cells are clear. Major intracranial vascular flow voids are preserved.  MRA HEAD FINDINGS  The visualized distal vertebral arteries are patent with the left being minimally larger than the right. PICA and SCA origins are patent. Basilar artery is patent without stenosis. There is a medium-sized left posterior communicating artery. PCAs are unremarkable.  Internal carotid arteries are patent from skullbase to carotid termini without stenosis. Anterior communicating artery is patent. ACAs and MCAs are unremarkable. No intracranial aneurysm is identified.  IMPRESSION: 1. Acute left basal ganglia infarct. 2. Unremarkable head MRA.   Electronically Signed   By: Logan Bores   On: 05/05/2015 17:42   Mr Jodene Nam Head/brain Wo Cm  05/05/2015   CLINICAL DATA:  Slurred speech, right facial droop, and right hand clumsiness.  EXAM: MRI HEAD WITHOUT CONTRAST  MRA HEAD WITHOUT CONTRAST  TECHNIQUE: Multiplanar, multiecho pulse sequences of the brain and surrounding structures were obtained without intravenous contrast. Angiographic images of the head were obtained using MRA technique without contrast.  COMPARISON:  Head CT 05/05/2015  FINDINGS: MRI HEAD FINDINGS  There is a small acute left lateral lenticulostriate territory infarct extending from the posterior left lentiform nucleus into the posterior body of the caudate. No abnormality is seen in the left frontal lobe to correspond to the questioned infarct in this region on the head CT, and this was likely artifactual. There is no evidence of intracranial hemorrhage, mass, midline shift, or extra-axial fluid collection. Ventricles and sulci are normal.  Orbits are unremarkable. Paranasal sinuses and mastoid air cells are clear. Major intracranial vascular flow voids are preserved.  MRA HEAD FINDINGS  The visualized distal  vertebral arteries are patent with the left being minimally larger than the right. PICA and SCA origins are patent. Basilar artery is patent without stenosis. There is a medium-sized left posterior communicating artery. PCAs are unremarkable.  Internal carotid arteries are patent from skullbase to carotid termini without stenosis. Anterior communicating artery is patent. ACAs and MCAs are unremarkable. No intracranial aneurysm is identified.  IMPRESSION: 1. Acute left basal ganglia infarct. 2. Unremarkable head MRA.   Electronically Signed   By: Logan Bores   On: 05/05/2015 17:42   Echo Left ventricle: The cavity size was normal. Wall thickness was increased in a pattern of moderate LVH. Systolic function was normal. The estimated ejection fraction was in the range of 60% to 65%. Wall motion was normal; there were no regional wall motion abnormalities.  Scheduled Meds: . aspirin  300 mg Rectal Daily   Or  . aspirin  325 mg Oral Daily  . atorvastatin  40 mg Oral q1800  . enoxaparin (LOVENOX) injection  40 mg Subcutaneous Q24H  . nicotine  21 mg Transdermal Q24H  . pantoprazole  40 mg Oral Daily   Continuous Infusions:  Time spent: 35 minutes  [facility] [email or web address], password [facility] 05/06/2015, 12:53 PM  LOS: 1 day

## 2015-05-06 NOTE — Progress Notes (Signed)
STROKE TEAM PROGRESS NOTE   HISTORY Jonathan Novak is a 44 y.o. male with no documented systemic medical illness, presenting following acute onset of clumsiness of his right hand as well as slurred speech and right facial droop. Patient also noticed coordination difficulty involving his right leg. He has not been on antiplatelets therapy. CT scan of his head showed an area of cortical lucency involving the anterior left parietal region, suspicious for acute cerebral infarction. NIH stroke score was 3.  LSN: 9:30 PM on 05/04/2015 tPA Given: No: Beyond time window for treatment consideration mRankin:   SUBJECTIVE (INTERVAL HISTORY) His wife is at the bedside.  Overall he feels his condition is improved   OBJECTIVE Temp:  [97.9 F (36.6 C)-98.5 F (36.9 C)] 97.9 F (36.6 C) (08/14 0551) Pulse Rate:  [51-68] 57 (08/14 0551) Cardiac Rhythm:  [-] Other (Comment) (08/14 0300) Resp:  [9-18] 18 (08/14 0551) BP: (120-158)/(71-99) 124/83 mmHg (08/14 0551) SpO2:  [96 %-100 %] 97 % (08/14 0551)  No results for input(s): GLUCAP in the last 168 hours.  Recent Labs Lab 05/05/15 0955  NA 138  K 4.1  CL 107  CO2 23  GLUCOSE 119*  BUN 14  CREATININE 0.89  CALCIUM 9.7    Recent Labs Lab 05/05/15 0955  AST 31  ALT 38  ALKPHOS 59  BILITOT 0.7  PROT 6.7  ALBUMIN 4.3    Recent Labs Lab 05/05/15 0955  WBC 7.4  NEUTROABS 4.8  HGB 16.0  HCT 46.0  MCV 80.8  PLT 257    Recent Labs Lab 05/05/15 0955  TROPONINI <0.03    Recent Labs  05/05/15 0955  LABPROT 13.3  INR 0.99    Recent Labs  05/05/15 1019  COLORURINE YELLOW  LABSPEC 1.015  PHURINE 7.0  GLUCOSEU NEGATIVE  HGBUR NEGATIVE  BILIRUBINUR NEGATIVE  KETONESUR NEGATIVE  PROTEINUR NEGATIVE  UROBILINOGEN 0.2  NITRITE NEGATIVE  LEUKOCYTESUR NEGATIVE       Component Value Date/Time   CHOL 238* 05/05/2015 0955   TRIG 325* 05/05/2015 0955   HDL 31* 05/05/2015 0955   CHOLHDL 7.7 05/05/2015 0955   VLDL 65*  05/05/2015 0955   LDLCALC 142* 05/05/2015 0955   No results found for: HGBA1C    Component Value Date/Time   LABOPIA POSITIVE* 05/05/2015 1019   COCAINSCRNUR NONE DETECTED 05/05/2015 1019   LABBENZ NONE DETECTED 05/05/2015 1019   AMPHETMU NONE DETECTED 05/05/2015 1019   THCU POSITIVE* 05/05/2015 1019   LABBARB NONE DETECTED 05/05/2015 1019     Recent Labs Lab 05/05/15 0955  ETH <5     IMAGING  Dg Chest 2 View 05/05/2015    1. Low lung volumes.  2. No acute cardiopulmonary disease.     Ct Head Wo Contrast 05/05/2015    Subtle area of cortical lucency in the anterior left parietal lobe which could represent a small area of subacute nonhemorrhagic infarction.    Mr Brain Wo Contrast 05/05/2015    1. Acute left basal ganglia infarct.  2. Unremarkable head MRA.      Mr Jodene Nam Head/brain Wo Cm 05/05/2015    1. Acute left basal ganglia infarct.  2. Unremarkable head MRA.     PHYSICAL EXAM HEENT- Normocephalic, no lesions, without obvious abnormality. Normal external eye and conjunctiva. Normal TM's bilaterally. Normal auditory canals and external ears. Normal external nose, mucus membranes and septum. Normal pharynx. Neck supple with no masses, nodes, nodules or enlargement. Cardiovascular - regular rate and rhythm, S1, S2 normal, no  murmur, click, rub or gallop Lungs - chest clear, no wheezing, rales, normal symmetric air entry Abdomen - soft, non-tender; bowel sounds normal; no masses, no organomegaly Extremities - no edema; deformity of distal right leg from previous trauma and surgery.  Neurologic Examination: Mental Status: Alert, oriented, thought content appropriate. Speech slightly slurred without evidence of aphasia. Able to follow commands without difficulty. Cranial Nerves: II-Visual fields were normal. III/IV/VI-Pupils were equal and reacted normally to light. Extraocular movements were full and conjugate.  V/VII-no facial numbness; mild right lower  facial weakness. VIII-normal. X-mild dysarthria; symmetrical palatal movement. XI: trapezius strength/neck flexion strength normal bilaterally XII-midline tongue extension with normal strength. Motor: 5/5 bilaterally with normal tone and bulk Sensory: Normal throughout. Deep Tendon Reflexes: 2+ and symmetric. Plantars: Mute bilaterally Cerebellar: Normal finger-to-nose testing.  ASSESSMENT [name] is a 44 y.o. male with history of marijuana use, tobacco use and alcohol use, but otherwise no significant past medical history presenting with right sided clumsiness, right facial droop and slurred speech. He did not receive IV t-PA due to late presentation  Stroke:  Dominant secondary to small vessel disease.  Resultant right facial droop and clumsiness of the right upper and lower extremities.  MRI acute left basal ganglia infarct  MRA  unremarkable  Carotid Doppler pending  2D Echo  EF 60-65%. No cardiac source of emboli identified.  LDL 142  HgbA1c pending  Lovenox for VTE prophylaxis  Diet Heart Room service appropriate?: Yes; Fluid consistency:: Thin  no antithrombotic prior to admission, now on aspirin 325 mg orally every day  Patient counseled to be compliant with his antithrombotic medications  Ongoing aggressive stroke risk factor management  Therapy recommendations: No physical therapy follow-up recommended. Occupational therapy evaluation pending  Disposition:  Pending  Hypertension  Home meds: No antihypertensives medications prior to admission  Stable  Patient counseled to be compliant with his blood pressure medications  Hyperlipidemia  Home meds: No lipid lowering medications prior to admission  LDL 142, goal < 70  Add Lipitor 80 mg daily  Continue statin at discharge  Diabetes  HgbA1c pending , goal < 7.0  Controlled - no history of diabetes mellitus  Other Stroke Risk Factors  Cigarette smoker, advised to stop  smoking  ETOH use  Obesity, Body mass index is 32.54 kg/(m^2).   Family hx stroke (both parents)  Other Active Problems  Urine drug screen positive for opiates and THCU (patient does not want his wife to know about his tobacco and marijuana use)  Hospital day # [place], [place]   NEUROLOGY ATTENDING NOTE Patient was seen and examined by me personally. I reviewed notes, independently viewed imaging studies, participated in medical decision making and plan of care. I have made additions or clarifications directly to the above note.  Documentation accurately reflects findings. The laboratory and radiographic studies were personally reviewed by me.  ROS completed by me personally and there were no pertinent positives  Assessment and plan completed by me personally and fully documented above.  Plans include:   Neuro:  Stroke work-up nearing completion  Discussed the importance of smoke cessation, follow-up with PCP, diet/exercise and not using recreational drugs  Cardiac:    Recommend high dose statin therapy   Pulmonary:  No acute issues; CXR with low volumes  GI:  No acute issues   Endocrine:  HgbA1c pending Heme/ID/Electrolytes:  No acute issues  Condition is improved  SIGNED BY: Dr. Elissa Hefty     To contact  Stroke Continuity provider, please refer to [email or web address] After hours, contact General Neurology

## 2015-05-06 NOTE — Progress Notes (Signed)
*  PRELIMINARY RESULTS* Echocardiogram 2D Echocardiogram has been performed.  Leavy Cella 05/06/2015, 9:44 AM

## 2015-05-07 ENCOUNTER — Inpatient Hospital Stay (HOSPITAL_COMMUNITY): Payer: BC Managed Care – PPO

## 2015-05-07 DIAGNOSIS — I639 Cerebral infarction, unspecified: Secondary | ICD-10-CM

## 2015-05-07 DIAGNOSIS — I6350 Cerebral infarction due to unspecified occlusion or stenosis of unspecified cerebral artery: Secondary | ICD-10-CM

## 2015-05-07 DIAGNOSIS — I10 Essential (primary) hypertension: Secondary | ICD-10-CM | POA: Insufficient documentation

## 2015-05-07 LAB — BETA-2-GLYCOPROTEIN I ABS, IGG/M/A: Beta-2 Glyco I IgG: <9 GPI IgG units (ref 0–20)

## 2015-05-07 LAB — HEMOGLOBIN A1C
Hgb A1c MFr Bld: 5.9 % — ABNORMAL HIGH (ref 4.8–5.6)
MEAN PLASMA GLUCOSE: 123 mg/dL

## 2015-05-07 LAB — CARDIOLIPIN ANTIBODIES, IGG, IGM, IGA
Anticardiolipin IgA: <9 APL U/mL (ref 0–11)
Anticardiolipin IgG: <9 GPL U/mL (ref 0–14)

## 2015-05-07 MED ORDER — HYDROCODONE-ACETAMINOPHEN 5-325 MG PO TABS: 1.0000 | ORAL_TABLET | Freq: Four times a day (QID) | ORAL | Status: DC | PRN

## 2015-05-07 MED ORDER — TRAMADOL HCL 50 MG PO TABS: 50.0000 mg | ORAL_TABLET | Freq: Two times a day (BID) | ORAL | Status: DC | PRN

## 2015-05-07 MED ORDER — ASPIRIN 325 MG PO TABS: 325.0000 mg | ORAL_TABLET | Freq: Every day | ORAL | Status: AC

## 2015-05-07 MED ORDER — ATORVASTATIN CALCIUM 80 MG PO TABS: 80.0000 mg | ORAL_TABLET | Freq: Every day | ORAL | Status: DC

## 2015-05-07 MED ORDER — NICOTINE 21 MG/24HR TD PT24: 21.0000 mg | MEDICATED_PATCH | TRANSDERMAL | Status: DC

## 2015-05-07 NOTE — Plan of Care (Signed)
Problem: Food- and Nutrition-Related Knowledge Deficit (NB-1.1) Goal: Nutrition education Formal process to instruct or train a patient/client in a skill or to impart knowledge to help patients/clients voluntarily manage or modify food choices and eating behavior to maintain or improve health. Outcome: Completed/Met Date Met:  05/07/15 Nutrition Education Note  RD consulted for nutrition education regarding a Heart Healthy diet.   Lipid Panel     Component Value Date/Time    CHOL 239* 05/06/2015 0814    TRIG 337* 05/06/2015 0814    HDL 29* 05/06/2015 0814    CHOLHDL 8.2 05/06/2015 0814    VLDL 67* 05/06/2015 0814    LDLCALC 143* 05/06/2015 0814    RD provided "Heart Healthy Nutrition Therapy" handout from the [employer]. Reviewed patient's dietary recall. Provided examples on ways to decrease sodium and fat intake in diet. Discouraged intake of processed foods and use of salt shaker. Encouraged fresh fruits and vegetables as well as whole grain sources of carbohydrates to maximize fiber intake. Teach back method used.  Expect good compliance.  Body mass index is 32.54 kg/(m^2). Pt meets criteria for class I obesity based on current BMI.  Current diet order is heart healthy. Appetite good with no other difficulties. Labs and medications reviewed. No further nutrition interventions warranted at this time. RD contact information provided. If additional nutrition issues arise, please re-consult RD.  Corrin Parker, MS, RD, LDN Pager # [phone] After hours/ weekend pager # [phone]

## 2015-05-07 NOTE — Progress Notes (Signed)
D/C orders received, pt for D/C home today.  IV and telemetry D/C.  Rx and D/C instructions given with verbalized understanding.  Family at bedside to assist with D/C.  Staff brought pt downstairs via wheelchair.  

## 2015-05-07 NOTE — Discharge Summary (Signed)
Physician Discharge Summary  Jonathan Novak FVC:[id number] DOB: 08/24/1971 DOA: 05/05/2015  PCP: Reita Cliche, MD  Admit date: 05/05/2015 Discharge date: 05/07/2015  Time spent: greater than 30 minutes  Recommendations for Outpatient Follow-up:  1. Monitor LFTs on statin 2. Follow up hypercoaguable workup. Pending at discharge. 3. Consider outpatient polysomnogram  Discharge Diagnoses:  Principal Problem:   Stroke Active Problems:   Tobacco abuse   Marijuana use   Acute ischemic stroke   Hyperlipidemia   Discharge Condition: stable  Diet recommendation: heart healthy  Filed Weights   05/05/15 0753  Weight: 108.863 kg (240 lb)    History of present illness:  44 y.o. male with no documented systemic medical illness, presenting following acute onset of clumsiness of his right hand as well as slurred speech and right facial droop. Patient also noticed coordination difficulty involving his right leg. He has not been on antiplatelets therapy. CT scan of his head showed an area of cortical lucency involving the anterior left parietal region, suspicious for acute cerebral infarction. NIH stroke score was 3. He was LKW 9:30 PM on 05/04/2015. Patient was not administered TPA secondary to beyond time window for treatment consideration  Hospital Course:  Admitted to telemetry. Neurology consulted.  Stroke: Dominant left basal ganglia infarct secondary to small vessel disease source  Resultant Dysarthria improving   MRI L BG infarct  MRA Unremarkable   Carotid Doppler unremarkable  2D Echo No source of embolus  LDL 142. Started on statin  HgbA1c 5.9  Hypercoagulable labs pending  Ongoing aggressive stroke risk factor management  Therapy recommendations: No PT  Disposition: Home  Follow up Dr. Erlinda Hong in 2 months.  Hyperlipidemia  Home meds: No statin  LDL 143, goal < 70  Now on lipitor 80 mg daily  Tobacco abuse  Current smoker  Smoking  cessation counseling provided  Nicotine patch provided  Pt is willing to quit  Bradycardia: HR 50s at rest. 30s during sleep. OSA? Consider outpatient polysomnogram  Procedures:  none  Consultations:  neurology  Discharge Exam: Filed Vitals:   05/07/15 1412  BP: 120/73  Pulse: 61  Temp: 98.2 F (36.8 C)  Resp: 20    General: a and o  Cardiovascular: RRR Respiratory: CTA Neuro: nonfocal. Speech clear and fluent  Discharge Instructions   Discharge Instructions    Ambulatory referral to Neurology    Complete by:  As directed   Dr. Erlinda Hong requests followup in 2 months     Diet - low sodium heart healthy    Complete by:  As directed      Increase activity slowly    Complete by:  As directed           Current Discharge Medication List    START taking these medications   Details  aspirin 325 MG tablet Take 1 tablet (325 mg total) by mouth daily.    atorvastatin (LIPITOR) 80 MG tablet Take 1 tablet (80 mg total) by mouth daily at 6 PM. Qty: 30 tablet, Refills: 1    nicotine (NICODERM CQ - DOSED IN MG/24 HOURS) 21 mg/24hr patch Place 1 patch (21 mg total) onto the skin daily. Qty: 28 patch, Refills: 0    traMADol (ULTRAM) 50 MG tablet Take 1 tablet (50 mg total) by mouth every 12 (twelve) hours as needed. Qty: 20 tablet, Refills: 0      CONTINUE these medications which have NOT CHANGED   Details  acetaminophen (TYLENOL) 500 MG tablet Take 1,000 mg by mouth every  6 (six) hours as needed for moderate pain.    ibuprofen (ADVIL,MOTRIN) 200 MG tablet Take 800-1,000 mg by mouth every 6 (six) hours as needed for moderate pain.       No Known Allergies Follow-up Information    Follow up with Jonathan Novak,Jindong, MD In 2 months.   Specialty:  Neurology   Why:  Stroke Clinic, Office will call you with appointment date & time   Contact information:   [address] [place] [address] [phone]        The results of significant diagnostics from  this hospitalization (including imaging, microbiology, ancillary and laboratory) are listed below for reference.    Significant Diagnostic Studies: Dg Chest 2 View  05/05/2015   CLINICAL DATA:  Stroke.  Smoking history.  EXAM: CHEST - 2 VIEW  COMPARISON:  None.  FINDINGS: The heart size and pulmonary interstitium ir exaggerated by low lung volumes. No focal airspace disease is present. There is no edema or effusion to suggest failure.  IMPRESSION: 1. Low lung volumes. 2. No acute cardiopulmonary disease.   Electronically Signed   By: San Morelle M.D.   On: 05/05/2015 17:47   Ct Head Wo Contrast  05/05/2015   CLINICAL DATA:  Right arm weakness and heaviness. Slurred speech since last night.  EXAM: CT HEAD WITHOUT CONTRAST  TECHNIQUE: Contiguous axial images were obtained from the base of the skull through the vertex without intravenous contrast.  COMPARISON:  None.  FINDINGS: There is a subtle area of cortical lucency in the anterior aspect of the left frontal lobe on image 22 of series 2 which could represent a small nonhemorrhagic infarct. There is no acute intracranial hemorrhage or mass lesion. Brain parenchyma is otherwise normal. Osseous structures are normal.  IMPRESSION: Subtle area of cortical lucency in the anterior left parietal lobe which could represent a small area of subacute nonhemorrhagic infarction.   Electronically Signed   By: Lorriane Shire M.D.   On: 05/05/2015 08:48   Mr Brain Wo Contrast  05/05/2015   CLINICAL DATA:  Slurred speech, right facial droop, and right hand clumsiness.  EXAM: MRI HEAD WITHOUT CONTRAST  MRA HEAD WITHOUT CONTRAST  TECHNIQUE: Multiplanar, multiecho pulse sequences of the brain and surrounding structures were obtained without intravenous contrast. Angiographic images of the head were obtained using MRA technique without contrast.  COMPARISON:  Head CT 05/05/2015  FINDINGS: MRI HEAD FINDINGS  There is a small acute left lateral lenticulostriate  territory infarct extending from the posterior left lentiform nucleus into the posterior body of the caudate. No abnormality is seen in the left frontal lobe to correspond to the questioned infarct in this region on the head CT, and this was likely artifactual. There is no evidence of intracranial hemorrhage, mass, midline shift, or extra-axial fluid collection. Ventricles and sulci are normal.  Orbits are unremarkable. Paranasal sinuses and mastoid air cells are clear. Major intracranial vascular flow voids are preserved.  MRA HEAD FINDINGS  The visualized distal vertebral arteries are patent with the left being minimally larger than the right. PICA and SCA origins are patent. Basilar artery is patent without stenosis. There is a medium-sized left posterior communicating artery. PCAs are unremarkable.  Internal carotid arteries are patent from skullbase to carotid termini without stenosis. Anterior communicating artery is patent. ACAs and MCAs are unremarkable. No intracranial aneurysm is identified.  IMPRESSION: 1. Acute left basal ganglia infarct. 2. Unremarkable head MRA.   Electronically Signed   By: Logan Bores  On: 05/05/2015 17:42   Mr Jodene Nam Head/brain Wo Cm  05/05/2015   CLINICAL DATA:  Slurred speech, right facial droop, and right hand clumsiness.  EXAM: MRI HEAD WITHOUT CONTRAST  MRA HEAD WITHOUT CONTRAST  TECHNIQUE: Multiplanar, multiecho pulse sequences of the brain and surrounding structures were obtained without intravenous contrast. Angiographic images of the head were obtained using MRA technique without contrast.  COMPARISON:  Head CT 05/05/2015  FINDINGS: MRI HEAD FINDINGS  There is a small acute left lateral lenticulostriate territory infarct extending from the posterior left lentiform nucleus into the posterior body of the caudate. No abnormality is seen in the left frontal lobe to correspond to the questioned infarct in this region on the head CT, and this was likely artifactual. There is no  evidence of intracranial hemorrhage, mass, midline shift, or extra-axial fluid collection. Ventricles and sulci are normal.  Orbits are unremarkable. Paranasal sinuses and mastoid air cells are clear. Major intracranial vascular flow voids are preserved.  MRA HEAD FINDINGS  The visualized distal vertebral arteries are patent with the left being minimally larger than the right. PICA and SCA origins are patent. Basilar artery is patent without stenosis. There is a medium-sized left posterior communicating artery. PCAs are unremarkable.  Internal carotid arteries are patent from skullbase to carotid termini without stenosis. Anterior communicating artery is patent. ACAs and MCAs are unremarkable. No intracranial aneurysm is identified.  IMPRESSION: 1. Acute left basal ganglia infarct. 2. Unremarkable head MRA.   Electronically Signed   By: Logan Bores   On: 05/05/2015 17:42    Microbiology: No results found for this or any previous visit (from the past 240 hour(s)).   Labs: Basic Metabolic Panel:  Recent Labs Lab 05/05/15 0955  NA 138  K 4.1  CL 107  CO2 23  GLUCOSE 119*  BUN 14  CREATININE 0.89  CALCIUM 9.7   Liver Function Tests:  Recent Labs Lab 05/05/15 0955  AST 31  ALT 38  ALKPHOS 59  BILITOT 0.7  PROT 6.7  ALBUMIN 4.3   No results for input(s): LIPASE, AMYLASE in the last 168 hours. No results for input(s): AMMONIA in the last 168 hours. CBC:  Recent Labs Lab 05/05/15 0955  WBC 7.4  NEUTROABS 4.8  HGB 16.0  HCT 46.0  MCV 80.8  PLT 257   Cardiac Enzymes:  Recent Labs Lab 05/05/15 0955  TROPONINI <0.03   BNP: BNP (last 3 results) No results for input(s): BNP in the last 8760 hours.  ProBNP (last 3 results) No results for input(s): PROBNP in the last 8760 hours.  CBG: No results for input(s): GLUCAP in the last 168 hours.     SignedDelfina Redwood  [facility] 05/07/2015, 3:17 PM

## 2015-05-07 NOTE — Progress Notes (Signed)
VASCULAR LAB PRELIMINARY  PRELIMINARY  PRELIMINARY  PRELIMINARY  Carotid duplex completed.    Preliminary report:  1-39% ICA stenosis.  Right vertebral artery flow not insonated secondary to patient movement and habitus.  Left vertebral artery flow is antegrade.   Jonathan Novak, RVT 05/07/2015, 4:17 PM
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
## 2015-05-07 NOTE — Progress Notes (Signed)
To whom it may concern:   Jonathan Novak is unable to work from 05/05/15 - 05/20/15 due to illness.    Sincerely,    Doree Barthel, MD [facility]

## 2015-05-08 LAB — LUPUS ANTICOAGULANT PANEL
DRVVT: 39.1 s (ref 0.0–55.1)
PTT Lupus Anticoagulant: 44.1 s (ref 0.0–50.0)

## 2015-05-08 LAB — PROTEIN S ACTIVITY: PROTEIN S ACTIVITY: 153 % — AB (ref 60–145)

## 2015-05-08 LAB — PROTEIN C ACTIVITY: Protein C Activity: 170 % — ABNORMAL HIGH (ref 74–151)

## 2015-05-08 LAB — PROTEIN C, TOTAL: PROTEIN C, TOTAL: 158 % — AB (ref 70–140)

## 2015-05-08 LAB — PROTEIN S, TOTAL: PROTEIN S AG TOTAL: 132 % (ref 58–150)

## 2015-05-09 LAB — HOMOCYSTEINE: Homocysteine: 11 umol/L (ref 0.0–15.0)

## 2015-05-10 LAB — FACTOR 5 LEIDEN

## 2015-05-10 LAB — PROTHROMBIN GENE MUTATION

## 2015-07-09 ENCOUNTER — Encounter: Payer: Self-pay | Admitting: Neurology

## 2015-07-09 ENCOUNTER — Ambulatory Visit (INDEPENDENT_AMBULATORY_CARE_PROVIDER_SITE_OTHER): Payer: BC Managed Care – PPO | Admitting: Neurology

## 2015-07-09 VITALS — BP 137/90 | HR 65 | Ht 72.0 in | Wt 233.2 lb

## 2015-07-09 DIAGNOSIS — F1210 Cannabis abuse, uncomplicated: Secondary | ICD-10-CM

## 2015-07-09 DIAGNOSIS — E785 Hyperlipidemia, unspecified: Secondary | ICD-10-CM

## 2015-07-09 DIAGNOSIS — I63312 Cerebral infarction due to thrombosis of left middle cerebral artery: Secondary | ICD-10-CM | POA: Diagnosis not present

## 2015-07-09 DIAGNOSIS — F17200 Nicotine dependence, unspecified, uncomplicated: Secondary | ICD-10-CM

## 2015-07-09 DIAGNOSIS — Z720 Tobacco use: Secondary | ICD-10-CM

## 2015-07-09 NOTE — Patient Instructions (Signed)
-   continue ASA and pitavastatin  - quit smoking completely - quit marijuana - will do sleep study to rule out apnea - Follow up with your primary care physician for stroke risk factor modification. Recommend maintain blood pressure goal <140/80, diabetes with hemoglobin A1c goal below 6.5% and lipids with LDL cholesterol goal below 70 mg/dL.  - check BP at home and record - follow up in 3 months

## 2015-07-11 DIAGNOSIS — E785 Hyperlipidemia, unspecified: Secondary | ICD-10-CM | POA: Insufficient documentation

## 2015-07-11 DIAGNOSIS — F1210 Cannabis abuse, uncomplicated: Secondary | ICD-10-CM | POA: Insufficient documentation

## 2015-07-11 DIAGNOSIS — F17200 Nicotine dependence, unspecified, uncomplicated: Secondary | ICD-10-CM | POA: Insufficient documentation

## 2015-07-11 NOTE — Progress Notes (Signed)
STROKE NEUROLOGY FOLLOW UP NOTE  NAME: Jonathan Novak DOB: Nov 19, 1970  REASON FOR VISIT: stroke follow up HISTORY FROM: pt and chart   Today we had the pleasure of seeing Jonathan Novak in follow-up at our Neurology Clinic. Pt was accompanied by no one.   History Summary Jonathan Novak is a 44 y.o. male with no significant past medical history presenting with acute onset of slurred speech, right facial weakness and clumsiness of right hand. He did not receive IV t-PA due to beyond time window. MRI showed left BG infarct. Other stroke work up including MRA, CUS, TTE, A1C, hypercoagulable work up all negative except LDL 142. He was put on ASA and lipitor and discharged in good condition to home.   Interval History During the interval time, the patient has been doing well. No neuro residue deficit. He did not tolerate lipitor and PCP changed to pitavastatin 64m. His LDL down from 142 to 87 as per pt. He quit smoking but use vapor nicotine 377mdaily. He still occasionally use THC. Admit that he may have OSA symptoms. BP 137/90    REVIEW OF SYSTEMS: Full 14 system review of systems performed and notable only for those listed below and in HPI above, all others are negative:  Constitutional:   Cardiovascular:  Ear/Nose/Throat:   Skin:  Eyes:   Respiratory:   Gastroitestinal:   Genitourinary:  Hematology/Lymphatic:   Endocrine:  Musculoskeletal:   Allergy/Immunology:   Neurological:   Psychiatric:  Sleep:   The following represents the patient's updated allergies and side effects list: No Known Allergies  The neurologically relevant items on the patient's problem list were reviewed on today's visit.  Neurologic Examination  A problem focused neurological exam (12 or more points of the single system neurologic examination, vital signs counts as 1 point, cranial nerves count for 8 points) was performed.  Blood pressure 137/90, pulse 65, height 6' (1.829 m), weight 233 lb  3.2 oz (105.779 kg).  General - Well nourished, well developed, in no apparent distress.  Ophthalmologic - Sharp disc margins OU.   Cardiovascular - Regular rate and rhythm with no murmur.  Mental Status -  Level of arousal and orientation to time, place, and person were intact. Language including expression, naming, repetition, comprehension was assessed and found intact. Attention span and concentration were normal. Recent and remote memory were intact. Fund of Knowledge was assessed and was intact.  Cranial Nerves II - XII - II - Visual field intact OU. III, IV, VI - Extraocular movements intact. V - Facial sensation intact bilaterally. VII - Facial movement intact bilaterally. VIII - Hearing & vestibular intact bilaterally. X - Palate elevates symmetrically. XI - Chin turning & shoulder shrug intact bilaterally. XII - Tongue protrusion intact.  Motor Strength - The patient's strength was normal in all extremities and pronator drift was absent.  Bulk was normal and fasciculations were absent.   Motor Tone - Muscle tone was assessed at the neck and appendages and was normal.  Reflexes - The patient's reflexes were 1+ in all extremities and he had no pathological reflexes.  Sensory - Light touch, temperature/pinprick, vibration and proprioception, and Romberg testing were assessed and were normal.    Coordination - The patient had normal movements in the hands and feet with no ataxia or dysmetria.  Tremor was absent.  Gait and Station - The patient's transfers, posture, gait, station, and turns were observed as normal.  Data reviewed: I personally reviewed the images and agree  with the radiology interpretations.  Dg Chest 2 View 05/05/2015 1. Low lung volumes. 2. No acute cardiopulmonary disease.   Ct Head Wo Contrast 05/05/2015 Subtle area of cortical lucency in the anterior left parietal lobe which could represent a small area of subacute nonhemorrhagic  infarction.   Mr Brain Wo Contrast 05/05/2015 Acute left basal ganglia infarct.   Mr Jodene Nam Head/brain Wo Cm 05/05/2015 Unremarkable head MRA.   2D Echo EF 60-65%. No cardiac source of emboli identified.  CUS - 1-39% ICA stenosis. Right vertebral artery flow not insonated secondary to patient movement and habitus. Left vertebral artery flow is antegrade.   Component     Latest Ref Rng 05/05/2015 05/06/2015  Cholesterol     0 - 200 mg/dL  239 (H)  Triglycerides     <150 mg/dL  337 (H)  HDL Cholesterol     >40 mg/dL  29 (L)  Total CHOL/HDL Ratio       8.2  VLDL     0 - 40 mg/dL  67 (H)  LDL (calc)     0 - 99 mg/dL  143 (H)  PTT Lupus Anticoagulant     0.0 - 50.0 sec 44.1   DRVVT     0.0 - 55.1 sec 39.1   Lupus Anticoag Interp      Comment:   Beta-2 Glyco I IgG     0 - 20 GPI IgG units <9   Beta-2-Glycoprotein I IgM     0 - 32 GPI IgM units <9   Beta-2-Glycoprotein I IgA     0 - 25 GPI IgA units <9   Anticardiolipin IgG     0 - 14 GPL U/mL <9   Anticardiolipin IgM     0 - 12 MPL U/mL <9   Anticardiolipin IgA     0 - 11 APL U/mL <9   Recommendations-F5LEID:      Comment   Comment      Comment   Recommendations-PTGENE:      Comment   Additional Information      Comment   Hemoglobin A1C     4.8 - 5.6 %  5.9 (H)  Mean Plasma Glucose       123  Antithrombin Activity     75 - 120 % 91   Protein C Activity     74 - 151 % 170 (H)   Protein C, Total     70 - 140 % 158 (H)   Protein S Activity     60 - 145 % 153 (H)   Protein S Ag, Total     58 - 150 % 132   Homocysteine     0.0 - 15.0 umol/L 11.0     Assessment: As you may recall, he is a 44 y.o. Caucasian male with PMH of smoker was admitted on 05/05/15 for left BG infarct on MRI. Stroke work up including MRA, CUS, TTE, A1C, hypercoagulable work up all negative except LDL 142. He was put on ASA and lipitor. He did not tolerate lipitor and changed to pitavastatin. LDL down from 142 to 87 as per pt. He  quit smoking but use vapor nicotine 27m daily. He still occasionally use THC. Admit that he may have OSA symptoms.   Plan:  - continue ASA and pitavastatin  - quit smoking completely - quit marijuana - will do sleep study to rule out apnea - Follow up with your primary care physician for stroke risk factor modification. Recommend  maintain blood pressure goal <140/80, diabetes with hemoglobin A1c goal below 6.5% and lipids with LDL cholesterol goal below 70 mg/dL.  - check BP at home and record - follow up in 3 months  I spent more than 25 minutes of face to face time with the patient. Greater than 50% of time was spent in counseling and coordination of care.    Orders Placed This Encounter  Procedures  . Ambulatory referral to Sleep Studies    Referral Priority:  Routine    Referral Type:  Consultation    Referral Reason:  Specialty Services Required    Number of Visits Requested:  1    Meds ordered this encounter  Medications  . LIVALO 2 MG TABS    Sig: TK 1 T PO D    Refill:  11  . HYDROcodone-acetaminophen (NORCO/VICODIN) 5-325 MG tablet    Sig: Take 1 tablet by mouth every 6 (six) hours as needed for moderate pain.    Patient Instructions  - continue ASA and pitavastatin  - quit smoking completely - quit marijuana - will do sleep study to rule out apnea - Follow up with your primary care physician for stroke risk factor modification. Recommend maintain blood pressure goal <140/80, diabetes with hemoglobin A1c goal below 6.5% and lipids with LDL cholesterol goal below 70 mg/dL.  - check BP at home and record - follow up in 3 months    Rosalin Hawking, MD PhD [facility] [address] [place], [place] [address] [phone]

## 2015-10-15 ENCOUNTER — Ambulatory Visit: Payer: BC Managed Care – PPO | Admitting: Neurology

## 2015-10-16 ENCOUNTER — Encounter: Payer: Self-pay | Admitting: Neurology

## 2016-06-21 ENCOUNTER — Encounter (HOSPITAL_BASED_OUTPATIENT_CLINIC_OR_DEPARTMENT_OTHER): Payer: Self-pay | Admitting: Emergency Medicine

## 2016-06-21 ENCOUNTER — Emergency Department (HOSPITAL_BASED_OUTPATIENT_CLINIC_OR_DEPARTMENT_OTHER)
Admission: EM | Admit: 2016-06-21 | Discharge: 2016-06-21 | Disposition: A | Discharge status: Home / Self Care | Payer: BC Managed Care – PPO | Attending: Emergency Medicine | Admitting: Emergency Medicine

## 2016-06-21 DIAGNOSIS — S51811A Laceration without foreign body of right forearm, initial encounter: Secondary | ICD-10-CM | POA: Diagnosis not present

## 2016-06-21 DIAGNOSIS — Y999 Unspecified external cause status: Secondary | ICD-10-CM | POA: Insufficient documentation

## 2016-06-21 DIAGNOSIS — Y939 Activity, unspecified: Secondary | ICD-10-CM | POA: Insufficient documentation

## 2016-06-21 DIAGNOSIS — I10 Essential (primary) hypertension: Secondary | ICD-10-CM | POA: Insufficient documentation

## 2016-06-21 DIAGNOSIS — Z79899 Other long term (current) drug therapy: Secondary | ICD-10-CM | POA: Insufficient documentation

## 2016-06-21 DIAGNOSIS — Z791 Long term (current) use of non-steroidal anti-inflammatories (NSAID): Secondary | ICD-10-CM | POA: Diagnosis not present

## 2016-06-21 DIAGNOSIS — F17210 Nicotine dependence, cigarettes, uncomplicated: Secondary | ICD-10-CM | POA: Diagnosis not present

## 2016-06-21 DIAGNOSIS — Z7982 Long term (current) use of aspirin: Secondary | ICD-10-CM | POA: Diagnosis not present

## 2016-06-21 DIAGNOSIS — Y929 Unspecified place or not applicable: Secondary | ICD-10-CM | POA: Diagnosis not present

## 2016-06-21 DIAGNOSIS — IMO0002 Reserved for concepts with insufficient information to code with codable children: Secondary | ICD-10-CM

## 2016-06-21 DIAGNOSIS — W01198A Fall on same level from slipping, tripping and stumbling with subsequent striking against other object, initial encounter: Secondary | ICD-10-CM | POA: Insufficient documentation

## 2016-06-21 MED ORDER — LIDOCAINE HCL 2 % IJ SOLN
20.0000 mL | Freq: Once | INTRAMUSCULAR | Status: AC
  Administered 2016-06-21: 400 mg
  Filled 2016-06-21: qty 20
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
## 2016-06-21 NOTE — Discharge Instructions (Signed)
Please read attached information. If you experience any new or worsening signs or symptoms please return to the emergency room for evaluation. Please follow-up with your primary care provider or specialist as discussed.  °

## 2016-06-21 NOTE — ED Notes (Signed)
PA at bedside.
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
## 2016-08-22 DIAGNOSIS — M6282 Rhabdomyolysis: Secondary | ICD-10-CM

## 2016-08-22 HISTORY — DX: Rhabdomyolysis: M62.82

## 2016-08-26 ENCOUNTER — Emergency Department (HOSPITAL_COMMUNITY): Payer: Worker's Compensation

## 2016-08-26 ENCOUNTER — Encounter (HOSPITAL_COMMUNITY): Payer: Self-pay | Admitting: Vascular Surgery

## 2016-08-26 ENCOUNTER — Emergency Department (HOSPITAL_COMMUNITY)
Admission: EM | Admit: 2016-08-26 | Discharge: 2016-08-26 | Disposition: A | Discharge status: Home / Self Care | Payer: Worker's Compensation | Attending: Emergency Medicine | Admitting: Emergency Medicine

## 2016-08-26 DIAGNOSIS — Y92481 Parking lot as the place of occurrence of the external cause: Secondary | ICD-10-CM | POA: Insufficient documentation

## 2016-08-26 DIAGNOSIS — Z79899 Other long term (current) drug therapy: Secondary | ICD-10-CM | POA: Insufficient documentation

## 2016-08-26 DIAGNOSIS — S8782XA Crushing injury of left lower leg, initial encounter: Secondary | ICD-10-CM | POA: Insufficient documentation

## 2016-08-26 DIAGNOSIS — F17210 Nicotine dependence, cigarettes, uncomplicated: Secondary | ICD-10-CM | POA: Insufficient documentation

## 2016-08-26 DIAGNOSIS — Y999 Unspecified external cause status: Secondary | ICD-10-CM | POA: Diagnosis not present

## 2016-08-26 DIAGNOSIS — Z7982 Long term (current) use of aspirin: Secondary | ICD-10-CM | POA: Insufficient documentation

## 2016-08-26 DIAGNOSIS — I10 Essential (primary) hypertension: Secondary | ICD-10-CM | POA: Insufficient documentation

## 2016-08-26 DIAGNOSIS — Y939 Activity, unspecified: Secondary | ICD-10-CM | POA: Insufficient documentation

## 2016-08-26 DIAGNOSIS — Z8673 Personal history of transient ischemic attack (TIA), and cerebral infarction without residual deficits: Secondary | ICD-10-CM | POA: Insufficient documentation

## 2016-08-26 DIAGNOSIS — S8001XA Contusion of right knee, initial encounter: Secondary | ICD-10-CM

## 2016-08-26 DIAGNOSIS — S7012XA Contusion of left thigh, initial encounter: Secondary | ICD-10-CM

## 2016-08-26 HISTORY — DX: Pure hypercholesterolemia, unspecified: E78.00

## 2016-08-26 LAB — TYPE AND SCREEN
ABO/RH(D): A POS
Antibody Screen: NEGATIVE

## 2016-08-26 LAB — CBC WITH DIFFERENTIAL/PLATELET
Basophils Absolute: 0.1 10*3/uL (ref 0.0–0.1)
Basophils Relative: 0 %
EOS PCT: 2 %
Eosinophils Absolute: 0.2 10*3/uL (ref 0.0–0.7)
HCT: 49.2 % (ref 39.0–52.0)
Hemoglobin: 16.6 g/dL (ref 13.0–17.0)
LYMPHS ABS: 2.3 10*3/uL (ref 0.7–4.0)
LYMPHS PCT: 16 %
MCH: 27.3 pg (ref 26.0–34.0)
MCHC: 33.7 g/dL (ref 30.0–36.0)
MCV: 80.9 fL (ref 78.0–100.0)
MONO ABS: 0.7 10*3/uL (ref 0.1–1.0)
Monocytes Relative: 5 %
Neutro Abs: 10.5 10*3/uL — ABNORMAL HIGH (ref 1.7–7.7)
Neutrophils Relative %: 76 %
PLATELETS: 240 10*3/uL (ref 150–400)
RBC: 6.08 MIL/uL — ABNORMAL HIGH (ref 4.22–5.81)
RDW: 13.6 % (ref 11.5–15.5)
WBC: 13.8 10*3/uL — ABNORMAL HIGH (ref 4.0–10.5)

## 2016-08-26 LAB — ABO/RH: ABO/RH(D): A POS

## 2016-08-26 LAB — BASIC METABOLIC PANEL
Anion gap: 9 (ref 5–15)
BUN: 16 mg/dL (ref 6–20)
CHLORIDE: 108 mmol/L (ref 101–111)
CO2: 22 mmol/L (ref 22–32)
CREATININE: 1.22 mg/dL (ref 0.61–1.24)
Calcium: 9.6 mg/dL (ref 8.9–10.3)
GFR calc Af Amer: >60 mL/min (ref >60)
GLUCOSE: 107 mg/dL — AB (ref 65–99)
POTASSIUM: 4 mmol/L (ref 3.5–5.1)
Sodium: 139 mmol/L (ref 135–145)

## 2016-08-26 LAB — CK: Total CK: 189 U/L (ref 49–397)

## 2016-08-26 MED ORDER — SODIUM CHLORIDE 0.9 % IV BOLUS (SEPSIS)
1000.0000 mL | Freq: Once | INTRAVENOUS | Status: AC
  Administered 2016-08-26: 1000 mL via INTRAVENOUS

## 2016-08-26 MED ORDER — HYDROMORPHONE HCL 2 MG/ML IJ SOLN
1.0000 mg | Freq: Once | INTRAMUSCULAR | Status: AC
  Administered 2016-08-26: 1 mg via INTRAVENOUS
  Filled 2016-08-26: qty 1

## 2016-08-26 MED ORDER — OXYCODONE-ACETAMINOPHEN 5-325 MG PO TABS
2.0000 | ORAL_TABLET | Freq: Once | ORAL | Status: AC
  Administered 2016-08-26: 2 via ORAL
  Filled 2016-08-26: qty 2

## 2016-08-26 MED ORDER — NAPROXEN 500 MG PO TABS: 500.0000 mg | ORAL_TABLET | Freq: Two times a day (BID) | ORAL | 0 refills | Status: DC

## 2016-08-26 MED ORDER — OXYCODONE-ACETAMINOPHEN 5-325 MG PO TABS
1.0000 | ORAL_TABLET | Freq: Once | ORAL | Status: AC
  Administered 2016-08-26: 1 via ORAL
  Filled 2016-08-26: qty 1

## 2016-08-26 MED ORDER — OXYCODONE-ACETAMINOPHEN 5-325 MG PO TABS: 1.0000 | ORAL_TABLET | ORAL | 0 refills | Status: DC | PRN

## 2016-08-26 NOTE — ED Notes (Signed)
Wife arrived with the patient's shorts and shoes.  Reiterated d/c paperwork.  Transported patient to the lobby via wheelchair and assisted him with getting into vehicle.
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
## 2016-08-26 NOTE — Progress Notes (Signed)
Orthopedic Tech Progress Note Patient Details:  Jonathan Novak [birth year] [id number]  Ortho Devices Type of Ortho Device: Crutches, Knee Immobilizer Ortho Device/Splint Location: LLE Ortho Device/Splint Interventions: Ordered, Application   Jonathan Novak 08/26/2016, 5:19 PM
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
## 2016-08-26 NOTE — Discharge Instructions (Signed)
Return to the ER immediately if you develop worsening swelling of her leg, weakness, or numbness. Otherwise ice, elevate, and take the anti-inflammatories given. Follow-up with orthopedics.

## 2016-08-26 NOTE — ED Notes (Signed)
Pt verbalized understanding discharge instructions and denies any further needs or questions at this time. VS stable

## 2016-08-28 ENCOUNTER — Emergency Department (HOSPITAL_BASED_OUTPATIENT_CLINIC_OR_DEPARTMENT_OTHER)
Admit: 2016-08-28 | Discharge: 2016-08-28 | Disposition: A | Discharge status: Home / Self Care | Payer: Worker's Compensation

## 2016-08-28 ENCOUNTER — Inpatient Hospital Stay (HOSPITAL_COMMUNITY)
Admission: EM | Admit: 2016-08-28 | Discharge: 2016-08-31 | DRG: 558 | Disposition: A | Discharge status: Home / Self Care | Payer: Worker's Compensation | Attending: Family Medicine | Admitting: Family Medicine

## 2016-08-28 ENCOUNTER — Encounter (HOSPITAL_COMMUNITY): Payer: Self-pay | Admitting: *Deleted

## 2016-08-28 DIAGNOSIS — M6282 Rhabdomyolysis: Secondary | ICD-10-CM | POA: Diagnosis not present

## 2016-08-28 DIAGNOSIS — Z79899 Other long term (current) drug therapy: Secondary | ICD-10-CM

## 2016-08-28 DIAGNOSIS — F1210 Cannabis abuse, uncomplicated: Secondary | ICD-10-CM | POA: Diagnosis present

## 2016-08-28 DIAGNOSIS — M7989 Other specified soft tissue disorders: Secondary | ICD-10-CM

## 2016-08-28 DIAGNOSIS — F17210 Nicotine dependence, cigarettes, uncomplicated: Secondary | ICD-10-CM | POA: Diagnosis present

## 2016-08-28 DIAGNOSIS — Z7982 Long term (current) use of aspirin: Secondary | ICD-10-CM

## 2016-08-28 DIAGNOSIS — R262 Difficulty in walking, not elsewhere classified: Secondary | ICD-10-CM

## 2016-08-28 DIAGNOSIS — S8782XD Crushing injury of left lower leg, subsequent encounter: Secondary | ICD-10-CM

## 2016-08-28 DIAGNOSIS — E785 Hyperlipidemia, unspecified: Secondary | ICD-10-CM | POA: Diagnosis present

## 2016-08-28 DIAGNOSIS — M79609 Pain in unspecified limb: Secondary | ICD-10-CM

## 2016-08-28 DIAGNOSIS — Z8249 Family history of ischemic heart disease and other diseases of the circulatory system: Secondary | ICD-10-CM

## 2016-08-28 DIAGNOSIS — Z79891 Long term (current) use of opiate analgesic: Secondary | ICD-10-CM

## 2016-08-28 DIAGNOSIS — I10 Essential (primary) hypertension: Secondary | ICD-10-CM | POA: Diagnosis present

## 2016-08-28 DIAGNOSIS — T796XXA Traumatic ischemia of muscle, initial encounter: Secondary | ICD-10-CM

## 2016-08-28 DIAGNOSIS — Z8673 Personal history of transient ischemic attack (TIA), and cerebral infarction without residual deficits: Secondary | ICD-10-CM

## 2016-08-28 HISTORY — DX: Other specified postprocedural states: Z98.890

## 2016-08-28 HISTORY — DX: Other complications of anesthesia, initial encounter: T88.59XA

## 2016-08-28 HISTORY — DX: Adverse effect of unspecified anesthetic, initial encounter: T41.45XA

## 2016-08-28 HISTORY — DX: Nausea with vomiting, unspecified: R11.2

## 2016-08-28 HISTORY — DX: Rhabdomyolysis: M62.82

## 2016-08-28 LAB — RENAL FUNCTION PANEL
ALBUMIN: 3.5 g/dL (ref 3.5–5.0)
ANION GAP: 7 (ref 5–15)
BUN: 15 mg/dL (ref 6–20)
CHLORIDE: 108 mmol/L (ref 101–111)
CO2: 25 mmol/L (ref 22–32)
Calcium: 8.1 mg/dL — ABNORMAL LOW (ref 8.9–10.3)
Creatinine, Ser: 0.95 mg/dL (ref 0.61–1.24)
Glucose, Bld: 89 mg/dL (ref 65–99)
PHOSPHORUS: 2.7 mg/dL (ref 2.5–4.6)
POTASSIUM: 3.7 mmol/L (ref 3.5–5.1)
Sodium: 140 mmol/L (ref 135–145)

## 2016-08-28 LAB — URINALYSIS, ROUTINE W REFLEX MICROSCOPIC
Bilirubin Urine: NEGATIVE
GLUCOSE, UA: NEGATIVE mg/dL
Hgb urine dipstick: NEGATIVE
Ketones, ur: NEGATIVE mg/dL
LEUKOCYTES UA: NEGATIVE
NITRITE: NEGATIVE
PH: 5 (ref 5.0–8.0)
Protein, ur: NEGATIVE mg/dL
SPECIFIC GRAVITY, URINE: 1.023 (ref 1.005–1.030)

## 2016-08-28 LAB — CK
CK TOTAL: 3251 U/L — AB (ref 49–397)
Total CK: 3944 U/L — ABNORMAL HIGH (ref 49–397)

## 2016-08-28 LAB — CBC
HCT: 38.5 % — ABNORMAL LOW (ref 39.0–52.0)
Hemoglobin: 13.5 g/dL (ref 13.0–17.0)
MCH: 28.1 pg (ref 26.0–34.0)
MCHC: 35.1 g/dL (ref 30.0–36.0)
MCV: 80.2 fL (ref 78.0–100.0)
Platelets: 231 K/uL (ref 150–400)
RBC: 4.8 MIL/uL (ref 4.22–5.81)
RDW: 14 % (ref 11.5–15.5)
WBC: 8.8 K/uL (ref 4.0–10.5)

## 2016-08-28 LAB — BASIC METABOLIC PANEL
Anion gap: 7 (ref 5–15)
BUN: 17 mg/dL (ref 6–20)
CHLORIDE: 106 mmol/L (ref 101–111)
CO2: 26 mmol/L (ref 22–32)
CREATININE: 1.13 mg/dL (ref 0.61–1.24)
Calcium: 8.7 mg/dL — ABNORMAL LOW (ref 8.9–10.3)
GFR calc Af Amer: >60 mL/min (ref >60)
GFR calc non Af Amer: >60 mL/min (ref >60)
Glucose, Bld: 112 mg/dL — ABNORMAL HIGH (ref 65–99)
Potassium: 4.2 mmol/L (ref 3.5–5.1)
SODIUM: 139 mmol/L (ref 135–145)

## 2016-08-28 LAB — PROTIME-INR
INR: 1.09
Prothrombin Time: 14.2 seconds (ref 11.4–15.2)

## 2016-08-28 LAB — APTT: APTT: 31 s (ref 24–36)

## 2016-08-28 LAB — URIC ACID: URIC ACID, SERUM: 4.2 mg/dL — AB (ref 4.4–7.6)

## 2016-08-28 MED ORDER — SODIUM CHLORIDE 0.9 % IV BOLUS (SEPSIS)
1000.0000 mL | Freq: Once | INTRAVENOUS | Status: AC
  Administered 2016-08-28: 1000 mL via INTRAVENOUS

## 2016-08-28 MED ORDER — SODIUM CHLORIDE 0.9 % IV BOLUS (SEPSIS): 30.0000 mL/kg | Freq: Once | INTRAVENOUS | Status: DC

## 2016-08-28 MED ORDER — OXYCODONE HCL 5 MG PO TABS
5.0000 mg | ORAL_TABLET | Freq: Four times a day (QID) | ORAL | Status: DC | PRN
  Administered 2016-08-29 – 2016-08-31 (×7): 10 mg via ORAL
  Filled 2016-08-28 (×8): qty 2

## 2016-08-28 MED ORDER — HYDROMORPHONE HCL 2 MG/ML IJ SOLN: 0.5000 mg | INTRAMUSCULAR | Status: DC | PRN

## 2016-08-28 MED ORDER — HEPARIN SODIUM (PORCINE) 5000 UNIT/ML IJ SOLN
5000.0000 [IU] | Freq: Three times a day (TID) | INTRAMUSCULAR | Status: DC
  Administered 2016-08-28: 5000 [IU] via SUBCUTANEOUS
  Filled 2016-08-28: qty 1

## 2016-08-28 MED ORDER — HYDROMORPHONE HCL 2 MG/ML IJ SOLN
1.0000 mg | Freq: Once | INTRAMUSCULAR | Status: AC
  Administered 2016-08-28: 1 mg via INTRAVENOUS
  Filled 2016-08-28: qty 1

## 2016-08-28 MED ORDER — ACETAMINOPHEN 325 MG PO TABS: 650.0000 mg | ORAL_TABLET | Freq: Four times a day (QID) | ORAL | Status: DC | PRN

## 2016-08-28 MED ORDER — ACETAMINOPHEN 650 MG RE SUPP: 650.0000 mg | Freq: Four times a day (QID) | RECTAL | Status: DC | PRN

## 2016-08-28 MED ORDER — OXYCODONE-ACETAMINOPHEN 5-325 MG PO TABS
1.0000 | ORAL_TABLET | Freq: Four times a day (QID) | ORAL | Status: DC | PRN
  Administered 2016-08-28 – 2016-08-31 (×11): 2 via ORAL
  Filled 2016-08-28 (×11): qty 2

## 2016-08-28 MED ORDER — SODIUM CHLORIDE 0.9 % IV SOLN
INTRAVENOUS | Status: DC
  Administered 2016-08-28: 23:00:00 via INTRAVENOUS

## 2016-08-28 MED ORDER — ONDANSETRON HCL 4 MG/2ML IJ SOLN
4.0000 mg | Freq: Once | INTRAMUSCULAR | Status: AC
  Administered 2016-08-28: 4 mg via INTRAVENOUS
  Filled 2016-08-28: qty 2

## 2016-08-28 MED ORDER — SODIUM CHLORIDE 0.9 % IV SOLN: INTRAVENOUS | Status: DC

## 2016-08-28 NOTE — Progress Notes (Signed)
Orthopedic Tech Progress Note Patient Details:  Jonathan Novak 1970/12/27 [id number]  Ortho Devices Type of Ortho Device: Ace wrap, Doran Durand splint Ortho Device/Splint Location: Bilateral Watson jones splints Ortho Device/Splint Interventions: Application   [name] 08/28/2016, 6:41 PM
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
## 2016-08-28 NOTE — Progress Notes (Signed)
VASCULAR LAB PRELIMINARY  PRELIMINARY  PRELIMINARY  PRELIMINARY  Left lower extremity venous duplex completed.    Preliminary report:  There is no DVT or SVT noted in the left lower extremity.   Called report to Margarita Mail, PA-C  Elya Diloreto, [facility], RVT 08/28/2016, 3:17 PM

## 2016-08-28 NOTE — ED Notes (Signed)
Pt to vascular.
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
## 2016-08-29 ENCOUNTER — Encounter (HOSPITAL_COMMUNITY): Payer: Self-pay | Admitting: General Practice

## 2016-08-29 DIAGNOSIS — Z79899 Other long term (current) drug therapy: Secondary | ICD-10-CM | POA: Diagnosis not present

## 2016-08-29 DIAGNOSIS — I10 Essential (primary) hypertension: Secondary | ICD-10-CM | POA: Diagnosis present

## 2016-08-29 DIAGNOSIS — Z7982 Long term (current) use of aspirin: Secondary | ICD-10-CM | POA: Diagnosis not present

## 2016-08-29 DIAGNOSIS — Z79891 Long term (current) use of opiate analgesic: Secondary | ICD-10-CM | POA: Diagnosis not present

## 2016-08-29 DIAGNOSIS — T796XXD Traumatic ischemia of muscle, subsequent encounter: Secondary | ICD-10-CM | POA: Diagnosis not present

## 2016-08-29 DIAGNOSIS — M6282 Rhabdomyolysis: Secondary | ICD-10-CM | POA: Diagnosis present

## 2016-08-29 DIAGNOSIS — E785 Hyperlipidemia, unspecified: Secondary | ICD-10-CM | POA: Diagnosis present

## 2016-08-29 DIAGNOSIS — S8782XD Crushing injury of left lower leg, subsequent encounter: Secondary | ICD-10-CM | POA: Diagnosis not present

## 2016-08-29 DIAGNOSIS — Z8249 Family history of ischemic heart disease and other diseases of the circulatory system: Secondary | ICD-10-CM | POA: Diagnosis not present

## 2016-08-29 DIAGNOSIS — Z8673 Personal history of transient ischemic attack (TIA), and cerebral infarction without residual deficits: Secondary | ICD-10-CM | POA: Diagnosis not present

## 2016-08-29 DIAGNOSIS — F1210 Cannabis abuse, uncomplicated: Secondary | ICD-10-CM | POA: Diagnosis present

## 2016-08-29 DIAGNOSIS — R262 Difficulty in walking, not elsewhere classified: Secondary | ICD-10-CM | POA: Diagnosis present

## 2016-08-29 DIAGNOSIS — F17210 Nicotine dependence, cigarettes, uncomplicated: Secondary | ICD-10-CM | POA: Diagnosis present

## 2016-08-29 LAB — COMPREHENSIVE METABOLIC PANEL
ALBUMIN: 3 g/dL — AB (ref 3.5–5.0)
ALT: 40 U/L (ref 17–63)
ANION GAP: 5 (ref 5–15)
AST: 55 U/L — ABNORMAL HIGH (ref 15–41)
Alkaline Phosphatase: 39 U/L (ref 38–126)
BUN: 12 mg/dL (ref 6–20)
CO2: 26 mmol/L (ref 22–32)
Calcium: 8.3 mg/dL — ABNORMAL LOW (ref 8.9–10.3)
Chloride: 109 mmol/L (ref 101–111)
Creatinine, Ser: 0.97 mg/dL (ref 0.61–1.24)
GFR calc non Af Amer: >60 mL/min (ref >60)
GLUCOSE: 120 mg/dL — AB (ref 65–99)
POTASSIUM: 4.2 mmol/L (ref 3.5–5.1)
SODIUM: 140 mmol/L (ref 135–145)
TOTAL PROTEIN: 5.1 g/dL — AB (ref 6.5–8.1)
Total Bilirubin: 0.6 mg/dL (ref 0.3–1.2)

## 2016-08-29 LAB — BASIC METABOLIC PANEL
ANION GAP: 5 (ref 5–15)
BUN: 9 mg/dL (ref 6–20)
CHLORIDE: 110 mmol/L (ref 101–111)
CO2: 25 mmol/L (ref 22–32)
Calcium: 8.4 mg/dL — ABNORMAL LOW (ref 8.9–10.3)
Creatinine, Ser: 0.99 mg/dL (ref 0.61–1.24)
GFR calc Af Amer: >60 mL/min (ref >60)
GLUCOSE: 106 mg/dL — AB (ref 65–99)
POTASSIUM: 4 mmol/L (ref 3.5–5.1)
SODIUM: 140 mmol/L (ref 135–145)

## 2016-08-29 LAB — CBC
HEMATOCRIT: 35.4 % — AB (ref 39.0–52.0)
HEMOGLOBIN: 11.9 g/dL — AB (ref 13.0–17.0)
MCH: 27.4 pg (ref 26.0–34.0)
MCHC: 33.6 g/dL (ref 30.0–36.0)
MCV: 81.6 fL (ref 78.0–100.0)
Platelets: 214 10*3/uL (ref 150–400)
RBC: 4.34 MIL/uL (ref 4.22–5.81)
RDW: 13.9 % (ref 11.5–15.5)
WBC: 5.8 10*3/uL (ref 4.0–10.5)

## 2016-08-29 LAB — CK: CK TOTAL: 2725 U/L — AB (ref 49–397)

## 2016-08-29 MED ORDER — ASPIRIN 325 MG PO TABS
325.0000 mg | ORAL_TABLET | Freq: Every day | ORAL | Status: DC
  Administered 2016-08-29 – 2016-08-31 (×3): 325 mg via ORAL
  Filled 2016-08-29 (×2): qty 1

## 2016-08-29 MED ORDER — SODIUM CHLORIDE 0.9 % IV SOLN
Freq: Once | INTRAVENOUS | Status: AC
  Administered 2016-08-29 (×2): via INTRAVENOUS

## 2016-08-29 MED ORDER — ASPIRIN 325 MG PO TABS
325.0000 mg | ORAL_TABLET | Freq: Every day | ORAL | Status: DC
  Filled 2016-08-29: qty 1

## 2016-08-29 NOTE — Progress Notes (Signed)
     Assessment: Active Problems:   Rhabdomyolysis  Crush injury with resulting rhabdomyolysis with improving labs - decreasing CK and Cr wnl.  Without evidence of compartment syndrome or need for surgical intervention. Responding to conservative measures.  Plan: Ice, elevate, continue compression, mobilize with PT, IVFs, pain control  Weight Bearing: Weight Bearing as Tolerated (WBAT) B LEx Dressings: Ace / padding / stocking  VTE prophylaxis: ASA, Foot pumps, ambulation.   Dispo: Home  When cleared by PT and medicine  Follow-up scheduled with Dr. Percell Miller in the office next Wednesday. Please call with questions.  Subjective:  Patient reports pain as mild to moderate - improving.  Controlled with PO medicines.  Objective:   VITALS:   Vitals:   08/28/16 1600 08/28/16 1645 08/28/16 1900 08/29/16 0500  BP: 128/83 139/91 122/60 128/74  Pulse: 85 75 85 67  Resp: 14 18 18 18   Temp:   98.1 F (36.7 C) 97.7 F (36.5 C)  TempSrc:   Oral Oral  SpO2: 97% 100% 99% 97%  Weight:    112.2 kg (247 lb 4.8 oz)  Height:       CBC Latest Ref Rng & Units 08/29/2016 08/28/2016 08/26/2016  WBC 4.0 - 10.5 K/uL 5.8 8.8 13.8(H)  Hemoglobin 13.0 - 17.0 g/dL 11.9(L) 13.5 16.6  Hematocrit 39.0 - 52.0 % 35.4(L) 38.5(L) 49.2  Platelets 150 - 400 K/uL 214 231 240   BMP Latest Ref Rng & Units 08/29/2016 08/28/2016 08/28/2016  Glucose 65 - 99 mg/dL 120(H) 89 112(H)  BUN 6 - 20 mg/dL 12 15 17   Creatinine 0.61 - 1.24 mg/dL 0.97 0.95 1.13  Sodium 135 - 145 mmol/L 140 140 139  Potassium 3.5 - 5.1 mmol/L 4.2 3.7 4.2  Chloride 101 - 111 mmol/L 109 108 106  CO2 22 - 32 mmol/L 26 25 26   Calcium 8.9 - 10.3 mg/dL 8.3(L) 8.1(L) 8.7(L)   Intake/Output      12/07 0701 - 12/08 0700 12/08 0701 - 12/09 0700   IV Piggyback 1000    Total Intake(mL/kg) 1000 (8.9)    Urine (mL/kg/hr) 1250    Total Output 1250     Net -250            Physical Exam General: NAD.  Supine in bed with legs elevated.  Very  comfortable.. Conversant. MSK Ace / padding / stocking and foot compression in place bilateral legs. Left lower extremity: Hematoma with ecchymosis stable since yesterday. Mildly tender. Thigh is soft. Left lower extremity also soft. Active and passive vigorous motion, EHL, FHL, dorsiflexion, plantar flexion without pain. Active knee flexion to about 80 with mild discomfort. Distal Sensation intact. Right lower extremity is less sore, neurovascular intact. Sensation intact.   Jonathan Novak III, PA-C 08/29/2016, 7:48 AM

## 2016-08-29 NOTE — Progress Notes (Signed)
Family Medicine Teaching Service Daily Progress Note Intern Pager: [phone]  Patient name: Jonathan Novak Medical record number: [id number] Date of birth: 1971-07-24 Age: 45 y.o. Gender: male  Primary Care Provider: Reita Cliche, MD Consultants: ortho Code Status: Full  Pt Overview and Major Events to Date:  1. Admitted to FMTS  Assessment and Plan: Yoshimi Sarr is a 45 y.o. male presenting with rhabdomyolysis following crush injury two days ago. PMH is significant for history of stroke, hyperlipidemia, tobacco abuse, marijuana use,   # Rhabdomyolysis:  2L NS bolus given in ED.  UOP 1.25 L, down 10 pounds since yesterday. CK down to 2725 from 3251 (3900 upon admission).  BMP wnl this morning.  Cr 0.97.  Negative UA.  LE doppler showed no signs of DVT.  No signs of compartment syndrome.  Ortho recommending ICE,elevation, bulky compressive wrap and PT/OT.   OT recommended no f/u, but 24 supervision.  - Strict Intake and Output. Daily weight.   - F/U CK  - f/u repeat BMP and AM BMP, CBC - encourage PO intake - monitor resp status - Holding statin. Consider restarting on discharge.  - Orthopedics consulted, appreciate recommendations. - PT consulted  #History of CVA: Hospitalized from 05/05/15-8/15-16 for stroke after presenting with right sided facial droop and slurred speech as well as decreased coordination of right upper and lower extremities. Diagnosed with dominant left basal ganglia infarct. Carotid doppler unremarkable. Echocardiogram normal.  Denies history of hypertension.  - Holding statin given presentation with rhabdomyolysis. Consider restarting on discharge.  - Monitor BP. Initiate antihypertensive if indicated.   # Tobacco Abuse/Marijuana Abuse: Refuses nicotine patch.  - hold nicotine patch  FEN/GI: Regular Diet Prophylaxis: none- per ortho  Disposition: discharge pending improvement  Subjective:  Patient feels well this morning.  Pain well controlled  with percocet.  Asking for advance of diet. Denies any NVD, CP or SOB.    Objective: Temp:  [97.7 F (36.5 C)-98.1 F (36.7 C)] 97.7 F (36.5 C) (12/08 0500) Pulse Rate:  [67-99] 67 (12/08 0500) Resp:  [14-18] 18 (12/08 0500) BP: (122-145)/(60-98) 128/74 (12/08 0500) SpO2:  [97 %-100 %] 97 % (12/08 0500) Weight:  [237 lb (107.5 kg)-247 lb 4.8 oz (112.2 kg)] 247 lb 4.8 oz (112.2 kg) (12/08 0500) Physical Exam: General: 45yo male resting comfortably in no apparent distress Eyes: non-injected ENTM: MMM Cardiovascular: RRR, no murmurs Respiratory: Clear to auscultation bilaterally, no increased work of breathing Gastrointestinal: soft NTND MSK: Large hematoma with ecchymosis on L lower extremity.  Mildly tender to palpation.  Thigh is soft.  Patient has passive ROM without pain and is without pain with plantarflexion/dorsiflexion.   Derm: No rashes noted. Warm. Neuro: CN II-XII intact. Neurovascularly intact, able to wiggle toes and has 5/5 strength in R and L lower extremities.  Psych: AAOx3. Normal affect.   Laboratory:  Recent Labs Lab 08/26/16 1147 08/28/16 1502 08/29/16 0452  WBC 13.8* 8.8 5.8  HGB 16.6 13.5 11.9*  HCT 49.2 38.5* 35.4*  PLT 240 231 214    Recent Labs Lab 08/28/16 1502 08/28/16 1838 08/29/16 0452  NA 139 140 140  K 4.2 3.7 4.2  CL 106 108 109  CO2 26 25 26   BUN 17 15 12   CREATININE 1.13 0.95 0.97  CALCIUM 8.7* 8.1* 8.3*  PROT  --   --  5.1*  BILITOT  --   --  0.6  ALKPHOS  --   --  39  ALT  --   --  40  AST  --   --  55*  GLUCOSE 112* 89 120*    Imaging/Diagnostic Tests: Dg Tibia/fibula Left  Result Date: 08/26/2016 CLINICAL DATA:  Pinned between 2 cars with left leg pain, initial encounter EXAM: LEFT TIBIA AND FIBULA - 2 VIEW COMPARISON:  None. FINDINGS: There is no evidence of fracture or other focal bone lesions. Soft tissues are unremarkable. IMPRESSION: No acute abnormality noted. Electronically Signed   By: Inez Catalina M.D.   On:  08/26/2016 12:44   Dg Ankle Complete Right  Result Date: 08/26/2016 CLINICAL DATA:  Pinned between 2 cars with right leg pain, initial encounter EXAM: RIGHT ANKLE - COMPLETE 3+ VIEW COMPARISON:  None. FINDINGS: There are changes consistent with prior surgical fixation of in the distal fibula and tibia. No hardware failure is noted. No acute fracture is seen. Significant degenerative changes of the tibiotalar articulation are noted. IMPRESSION: Chronic changes without acute abnormality. Electronically Signed   By: Inez Catalina M.D.   On: 08/26/2016 12:45   Dg Knee Complete 4 Views Left  Result Date: 08/26/2016 CLINICAL DATA:  Pinned between 2 cars with left leg pain, initial encounter EXAM: LEFT KNEE - COMPLETE 4+ VIEW COMPARISON:  None. FINDINGS: No acute fracture or dislocation is noted. Mild meniscal calcifications are seen. No joint effusion is noted. IMPRESSION: No acute abnormality noted. Electronically Signed   By: Inez Catalina M.D.   On: 08/26/2016 12:41   Dg Knee Complete 4 Views Right  Result Date: 08/26/2016 CLINICAL DATA:  Pinned between 2 cars with right leg pain, initial encounter EXAM: RIGHT KNEE - COMPLETE 4+ VIEW COMPARISON:  None. FINDINGS: No acute fracture or dislocation is noted. Mild meniscal calcifications are seen. The patella is not visualized consistent with prior surgical resection. Multiple small bony densities are noted in the expected region of the patella. No joint effusion is seen. IMPRESSION: No acute abnormality noted. Chronic changes as described above. Electronically Signed   By: Inez Catalina M.D.   On: 08/26/2016 12:42   Dg Femur Min 2 Views Left  Result Date: 08/26/2016 CLINICAL DATA:  Pinned between 2 cars with left leg pain, initial encounter EXAM: LEFT FEMUR 2 VIEWS COMPARISON:  None. FINDINGS: There is no evidence of fracture or other focal bone lesions. Soft tissues are unremarkable. IMPRESSION: No acute abnormality noted. Electronically Signed   By: Inez Catalina M.D.   On: 08/26/2016 12:40   Eloise Levels, MD 08/29/2016, 8:26 AM PGY-1, [facility] Intern pager: [phone], text pages welcome

## 2016-08-29 NOTE — Evaluation (Addendum)
Occupational Therapy Evaluation Patient Details Name: Jonathan Novak MRN: [id number] DOB: 04-23-1971 Today's Date: 08/29/2016    History of Present Illness Pt is a 45 y.o. male who presents with rhabdomyolisis following crush injury 12/5. Pt has a PMH significant for stroke, hyperlipidemia, tobacco use and marijuana use.   Clinical Impression   Prior to crush injury 08/26/16, pt was independent with all ADL and functional mobility. Pt was utilizing crutches for functional mobility and requiring min assist with LB dressing after injury. Pt currently requires mod assist with LB ADL and min guard assist for functional mobility with crutches. Pt would benefit from continued OT services while admitted to improve independence with ADL and functional mobility. Pt will have 24 hour assistance from wife for a few days this weekend but she will be working during the week. No OT follow-up recommended post-acute D/C. OT will continue to follow acutely with focus on tub transfers and LB ADL.    Follow Up Recommendations  No OT follow up;Supervision/Assistance - 24 hour    Equipment Recommendations  3 in 1 bedside commode    Recommendations for Other Services       Precautions / Restrictions Precautions Precautions: Fall Restrictions Weight Bearing Restrictions: Yes LLE Weight Bearing: Weight bearing as tolerated      Mobility Bed Mobility Overal bed mobility: Modified Independent             General bed mobility comments: Increased time and effort  Transfers Overall transfer level: Needs assistance Equipment used: Crutches Transfers: Sit to/from Stand Sit to Stand: Min guard         General transfer comment: Min guard for safety.    Balance Overall balance assessment: Needs assistance Sitting-balance support: Feet supported;No upper extremity supported Sitting balance-Leahy Scale: Good     Standing balance support: Single extremity supported;During functional  activity Standing balance-Leahy Scale: Fair Standing balance comment: Able to stand statically without UE support but requires single UE support for grooming at sink and B UE for ambulation.                            ADL Overall ADL's : Needs assistance/impaired     Grooming: Wash/dry hands;Supervision/safety;Standing   Upper Body Bathing: Set up;Sitting   Lower Body Bathing: Moderate assistance;Sit to/from stand   Upper Body Dressing : Set up;Sitting   Lower Body Dressing: Moderate assistance;Sit to/from stand   Toilet Transfer: Ambulation;Min guard (crutches)   Toileting- Clothing Manipulation and Hygiene: Supervision/safety;Sit to/from stand   Tub/ Banker: Walk-in shower;Ambulation;Min guard (crutches)   Functional mobility during ADLs: Min guard (crutches) General ADL Comments: Pt educated on dressing techniques, safety during ADL transfers, and use of AE for LB ADL.     Vision Vision Assessment?: No apparent visual deficits   Perception     Praxis      Pertinent Vitals/Pain Pain Assessment: 0-10 Pain Score: 7  Pain Location: B LE's Pain Descriptors / Indicators: Throbbing;Aching;Constant     Hand Dominance Right   Extremity/Trunk Assessment Upper Extremity Assessment Upper Extremity Assessment: Overall WFL for tasks assessed   Lower Extremity Assessment Lower Extremity Assessment: LLE deficits/detail;RLE deficits/detail RLE Deficits / Details: Decreased strength and ROM with pain with all movement but less than L. RLE: Unable to fully assess due to pain LLE Deficits / Details: Decreased strength and ROM with increased pain with movement greater than R.       Communication Communication Communication: No difficulties  Cognition Arousal/Alertness: Awake/alert Behavior During Therapy: WFL for tasks assessed/performed Overall Cognitive Status: Within Functional Limits for tasks assessed                     General  Comments       Exercises       Shoulder Instructions      Home Living Family/patient expects to be discharged to:: Private residence Living Arrangements: Spouse/significant other Available Help at Discharge: Friend(s);Available PRN/intermittently (Wife works during the day) Type of Home: House Home Access: Stairs to enter Technical brewer of Steps: 6 Entrance Stairs-Rails: Left Home Layout: One level     Bathroom Shower/Tub: Tub/shower unit Shower/tub characteristics: Architectural technologist: Standard     Home Equipment: Crutches;Hand held shower head          Prior Functioning/Environment Level of Independence: Independent        Comments: Working        OT Problem List: Decreased strength;Decreased range of motion;Decreased activity tolerance;Impaired balance (sitting and/or standing);Decreased knowledge of use of DME or AE;Pain   OT Treatment/Interventions: Self-care/ADL training;Therapeutic exercise;Therapeutic activities;Balance training;Patient/family education    OT Goals(Current goals can be found in the care plan section) Acute Rehab OT Goals Patient Stated Goal: get back to normal and working OT Goal Formulation: With patient Time For Goal Achievement: 09/12/16 Potential to Achieve Goals: Good ADL Goals Pt Will Perform Lower Body Bathing: with modified independence;with adaptive equipment;sit to/from stand Pt Will Perform Lower Body Dressing: with modified independence;with adaptive equipment;sit to/from stand Pt Will Transfer to Toilet: with modified independence;ambulating (with crutches) Pt Will Perform Toileting - Clothing Manipulation and hygiene: with modified independence;sit to/from stand Pt Will Perform Tub/Shower Transfer: with modified independence;Shower transfer;3 in 1;ambulating (crutches)  OT Frequency: Min 2X/week   Barriers to D/C:            Co-evaluation              End of Session Equipment Utilized During  Treatment: Gait belt (crutches)  Activity Tolerance: Patient tolerated treatment well Patient left: in chair;with call bell/phone within reach (With B LE's elevated)   Time: 5830-9407 OT Time Calculation (min): 35 min Charges:  OT General Charges $OT Visit: 1 Procedure OT Evaluation $OT Eval Moderate Complexity: 1 Procedure OT Treatments $Self Care/Home Management : 8-22 mins G-Codes: OT G-codes **NOT FOR INPATIENT CLASS** Functional Assessment Tool Used: clinical judgement Functional Limitation: Self care Self Care Current Status (W8088): At least 20 percent but less than 40 percent impaired, limited or restricted Self Care Goal Status (P1031): At least 1 percent but less than 20 percent impaired, limited or restricted  Norman Herrlich, OTR/L [phone] 08/29/2016, 11:29 AM

## 2016-08-29 NOTE — Evaluation (Signed)
Physical Therapy Evaluation Patient Details Name: Jonathan Novak MRN: [id number] DOB: 24-Apr-1971 Today's Date: 08/29/2016   History of Present Illness  Pt is a 45 y.o. male who presents with rhabdomyolisis following crush injury 12/5. Pt has a PMH significant for stroke, hyperlipidemia, tobacco use and marijuana use.  Clinical Impression  Pt presents with the above diagnosis in recliner. Pt is alert and agreeable for therapy evaluation. Prior to admission and injury, pt was completely independent including working full time as a Scientist, clinical (histocompatibility and immunogenetics). Pt currently has bilateral LE edema and pain limiting his ability to move around without PRN use of crutches. Pt lives in a single level home with his wife and two young children. During this assessment, pt was able to maneuver throughout hallways with crutches with no assistance and to bathroom with supervision with the IV pole. Pt will benefit from follow-up from outpatient therapy upon discharge in order to improve strength and motion in bilateral Le's. Pt will benefit from being seen acutely in order to improve LE strength and endurance prior to DC.    Follow Up Recommendations Outpatient PT    Equipment Recommendations  None recommended by PT    Recommendations for Other Services       Precautions / Restrictions Precautions Precautions: Fall Precaution Comments: pt is able to ambulate without AD to bathroom with supervision Required Braces or Orthoses: Knee Immobilizer - Left Knee Immobilizer - Left: On when out of bed or walking Restrictions Weight Bearing Restrictions: Yes RLE Weight Bearing: Weight bearing as tolerated LLE Weight Bearing: Weight bearing as tolerated      Mobility  Bed Mobility               General bed mobility comments: Up in recliner   Transfers Overall transfer level: Modified independent Equipment used: Crutches Transfers: Sit to/from Stand Sit to Stand: Modified independent (Device/Increase  time)         General transfer comment: Min guard for safety.  Ambulation/Gait Ambulation/Gait assistance: Modified independent (Device/Increase time);Supervision Ambulation Distance (Feet): 150 Feet Assistive device: Crutches Gait Pattern/deviations: Step-through pattern;Antalgic Gait velocity: decreased Gait velocity interpretation: Below normal speed for age/gender General Gait Details: mild antalgic gait noted bilateral LE's  Stairs            Wheelchair Mobility    Modified Rankin (Stroke Patients Only)       Balance Overall balance assessment: Needs assistance Sitting-balance support: Feet supported;No upper extremity supported Sitting balance-Leahy Scale: Good Sitting balance - Comments: sitting edge of chair   Standing balance support: No upper extremity supported Standing balance-Leahy Scale: Good Standing balance comment: able to stand without UE support and able to walk without AD                             Pertinent Vitals/Pain Pain Assessment: 0-10 Pain Score: 8  Pain Location: B LE's Pain Descriptors / Indicators: Throbbing;Aching;Constant    Home Living Family/patient expects to be discharged to:: Private residence Living Arrangements: Spouse/significant other;Children Available Help at Discharge: Family;Friend(s);Available PRN/intermittently Type of Home: House Home Access: Stairs to enter Entrance Stairs-Rails: Left Entrance Stairs-Number of Steps: 6 Home Layout: One level Home Equipment: Crutches;Hand held shower head      Prior Function Level of Independence: Independent         Comments: Working, Energy manager Dominance   Dominant Hand: Right    Extremity/Trunk Assessment   Upper Extremity Assessment: Defer  to OT evaluation           Lower Extremity Assessment: RLE deficits/detail;LLE deficits/detail RLE Deficits / Details: Decreased strength and ROM with pain with all movement but less  than L. LLE Deficits / Details: Decreased strength and ROM with increased pain with movement greater than R.  Cervical / Trunk Assessment: Normal  Communication   Communication: No difficulties  Cognition Arousal/Alertness: Awake/alert Behavior During Therapy: WFL for tasks assessed/performed Overall Cognitive Status: Within Functional Limits for tasks assessed                      General Comments      Exercises     Assessment/Plan    PT Assessment Patient needs continued PT services  PT Problem List Decreased strength;Decreased range of motion;Decreased activity tolerance;Decreased balance;Decreased mobility;Decreased knowledge of use of DME;Pain          PT Treatment Interventions DME instruction;Gait training;Stair training;Functional mobility training;Therapeutic activities;Therapeutic exercise;Balance training;Patient/family education    PT Goals (Current goals can be found in the Care Plan section)  Acute Rehab PT Goals Patient Stated Goal: get back to normal and working PT Goal Formulation: With patient Time For Goal Achievement: 09/05/16 Potential to Achieve Goals: Good    Frequency Min 5X/week   Barriers to discharge        Co-evaluation               End of Session Equipment Utilized During Treatment: Gait belt;Left knee immobilizer Activity Tolerance: Patient tolerated treatment well;Patient limited by pain Patient left: in chair;with call bell/phone within reach Nurse Communication: Mobility status    Functional Assessment Tool Used: clincian exerpience, gait analysis, functional mobility assessment Functional Limitation: Mobility: Walking and moving around Mobility: Walking and Moving Around Current Status (M0867): At least 1 percent but less than 20 percent impaired, limited or restricted Mobility: Walking and Moving Around Goal Status [phone]): 0 percent impaired, limited or restricted    Time: 1341-1405 PT Time Calculation (min)  (ACUTE ONLY): 24 min   Charges:   PT Evaluation $PT Eval Low Complexity: 1 Procedure PT Treatments $Gait Training: 8-22 mins   PT G Codes:   PT G-Codes **NOT FOR INPATIENT CLASS** Functional Assessment Tool Used: clincian exerpience, gait analysis, functional mobility assessment Functional Limitation: Mobility: Walking and moving around Mobility: Walking and Moving Around Current Status (D3267): At least 1 percent but less than 20 percent impaired, limited or restricted Mobility: Walking and Moving Around Goal Status [phone]): 0 percent impaired, limited or restricted    Scheryl Marten PT, DPT  [phone]  08/29/2016, 2:22 PM

## 2016-08-29 NOTE — Progress Notes (Signed)
Evaluated patient's pain and pulses at bedside around 8 pm. He reports improved pain scores to 6-7 out of 10 compared to 10/10 on admission. Unwrapped leg dressings and patient thinks swelling has improved. It is still painful for him to lift his legs, especially the left. He has 1+ pitting edema at ankles. DP pulse barely audible by doppler of L foot. PT pulse strong and palpable by touch. Patient's nurse replaced dressings. Patient denies trouble breathing. Will continue IVFs at 200 mL/hr overnight.

## 2016-08-30 DIAGNOSIS — S8782XD Crushing injury of left lower leg, subsequent encounter: Secondary | ICD-10-CM

## 2016-08-30 DIAGNOSIS — R262 Difficulty in walking, not elsewhere classified: Secondary | ICD-10-CM

## 2016-08-30 DIAGNOSIS — T796XXD Traumatic ischemia of muscle, subsequent encounter: Secondary | ICD-10-CM

## 2016-08-30 LAB — BASIC METABOLIC PANEL
Anion gap: 6 (ref 5–15)
BUN: 10 mg/dL (ref 6–20)
CALCIUM: 8.3 mg/dL — AB (ref 8.9–10.3)
CO2: 25 mmol/L (ref 22–32)
Chloride: 108 mmol/L (ref 101–111)
Creatinine, Ser: 0.94 mg/dL (ref 0.61–1.24)
GFR calc Af Amer: >60 mL/min (ref >60)
GLUCOSE: 108 mg/dL — AB (ref 65–99)
Potassium: 3.7 mmol/L (ref 3.5–5.1)
Sodium: 139 mmol/L (ref 135–145)

## 2016-08-30 LAB — CBC
HEMATOCRIT: 34.7 % — AB (ref 39.0–52.0)
Hemoglobin: 11.5 g/dL — ABNORMAL LOW (ref 13.0–17.0)
MCH: 27.1 pg (ref 26.0–34.0)
MCHC: 33.1 g/dL (ref 30.0–36.0)
MCV: 81.6 fL (ref 78.0–100.0)
Platelets: 247 10*3/uL (ref 150–400)
RBC: 4.25 MIL/uL (ref 4.22–5.81)
RDW: 13.9 % (ref 11.5–15.5)
WBC: 6.4 10*3/uL (ref 4.0–10.5)

## 2016-08-30 LAB — CK: CK TOTAL: 2537 U/L — AB (ref 49–397)

## 2016-08-30 MED ORDER — LORAZEPAM 0.5 MG PO TABS
0.5000 mg | ORAL_TABLET | Freq: Four times a day (QID) | ORAL | Status: DC | PRN
  Administered 2016-08-30 – 2016-08-31 (×4): 0.5 mg via ORAL
  Filled 2016-08-30 (×4): qty 1

## 2016-08-30 MED ORDER — SODIUM CHLORIDE 0.9 % IV SOLN
Freq: Once | INTRAVENOUS | Status: AC
  Administered 2016-08-30: 13:00:00 via INTRAVENOUS

## 2016-08-30 NOTE — Discharge Summary (Signed)
[facility] Discharge Summary  Patient name: Jonathan Novak Medical record number: [id number] Date of birth: April 17, 1971 Age: 45 y.o. Gender: male Date of Admission: 08/28/2016  Date of Discharge: 08/31/2016 Admitting Physician: Leeanne Rio, MD  Primary Care Provider: Reita Cliche, MD Consultants: Orthopedic surgery  Indication for Hospitalization: Crush injury  Discharge Diagnoses/Problem List:  Active Problems:   Rhabdomyolysis   Crush injury lower leg, left, subsequent encounter   Difficulty in walking, not elsewhere classified    Disposition: discharge home  Discharge Condition: stable, improved  Discharge Exam:  See PE from Dr. Vanetta Shawl on 08/31/16  Brief Hospital Course:  Patient reported to emergency department several days after suffering a crush injury to his lower extremities. Upon presentation to the emergency department his CK was elevated to 3944 and his creatinine and potassium were both within normal limits.  X-rays of bilateral knees, femurs tibia/fibula and ankles showed no acute changes.  Started on IV hydration, pain medication and was consulted by physical therapy and occupational therapy and his CK levels were monitored. He was initially given a 2 L normal saline bolus in the emergency department and then continued on normal saline at 300 mL/h 12 hours and titrated this down to keep urine output around 200-300 mL/h.  He was followed by orthopedic surgery who recommended ice, elevation and continuing pain medication.  At one point during admission his DP pulses were unable to be obtained on the left lower extremity and there was briefly concern for compartment syndrome. He was seen by orthopedic surgery who determined that he did not have compartment syndrome.  The time of discharge his CK had trended down to 1742 and his potassium and creatinine remained within normal limits.  His pain was well controlled and he was discharged  with a course of pain medication and was recommended for outpatient physical therapy and 24-hour supervision/assistance by occupational therapy.  Return precautions were discussed with patient and he was discharged with plans to follow-up with his primary care doctor.  I ssues for Follow Up:  1. Crush injury: Patient was improving upon discharge, and is recommended to continue with outpatient physical therapy and to have 24-hour supervision.  Discussed return precautions.  Significant Procedures: None  Significant Labs and Imaging:   None  Recent Labs Lab 08/28/16 1502 08/29/16 0452 08/30/16 0406  WBC 8.8 5.8 6.4  HGB 13.5 11.9* 11.5*  HCT 38.5* 35.4* 34.7*  PLT 231 214 247    Recent Labs Lab 08/28/16 1838 08/29/16 0452 08/29/16 1511 08/30/16 0406 08/31/16 0336  NA 140 140 140 139 139  K 3.7 4.2 4.0 3.7 3.7  CL 108 109 110 108 108  CO2 25 26 25 25 25   GLUCOSE 89 120* 106* 108* 114*  BUN 15 12 9 10 12   CREATININE 0.95 0.97 0.99 0.94 1.00  CALCIUM 8.1* 8.3* 8.4* 8.3* 8.5*  PHOS 2.7  --   --   --   --   ALKPHOS  --  39  --   --   --   AST  --  55*  --   --   --   ALT  --  40  --   --   --   ALBUMIN 3.5 3.0*  --   --   --    No results found.  Results/Tests Pending at Time of Discharge: None  Discharge Medications:    Medication List    TAKE these medications   aspirin 325 MG tablet Take  1 tablet (325 mg total) by mouth daily.   atorvastatin 80 MG tablet Commonly known as:  LIPITOR Take 1 tablet (80 mg total) by mouth daily at 6 PM.   cetirizine 10 MG tablet Commonly known as:  ZYRTEC Take 10 mg by mouth at bedtime.   LIVALO 2 MG Tabs Generic drug:  Pitavastatin Calcium Take 2 mg by mouth at bedtime.   multivitamin with minerals Tabs tablet Take 1 tablet by mouth daily.   naproxen 500 MG tablet Commonly known as:  NAPROSYN Take 1 tablet (500 mg total) by mouth 2 (two) times daily with a meal.   nicotine 21 mg/24hr patch Commonly known as:  NICODERM  CQ - dosed in mg/24 hours Place 1 patch (21 mg total) onto the skin daily.   oxyCODONE-acetaminophen 5-325 MG tablet Commonly known as:  PERCOCET Take 1 tablet by mouth every 4 (four) hours as needed for severe pain. What changed:  Another medication with the same name was added. Make sure you understand how and when to take each.   oxyCODONE-acetaminophen 5-325 MG tablet Commonly known as:  PERCOCET/ROXICET Take 1-2 tablets by mouth every 6 (six) hours as needed for severe pain. What changed:  You were already taking a medication with the same name, and this prescription was added. Make sure you understand how and when to take each.   Testosterone 20.25 MG/ACT (1.62%) Gel Apply 3 Squirts topically every morning. shoulders   traMADol 50 MG tablet Commonly known as:  ULTRAM Take 1 tablet (50 mg total) by mouth every 12 (twelve) hours as needed. What changed:  reasons to take this       Discharge Instructions: Please refer to Patient Instructions section of EMR for full details.  Patient was counseled important signs and symptoms that should prompt return to medical care, changes in medications, dietary instructions, activity restrictions, and follow up appointments.   Follow-Up Appointments: Follow-up Information    Reita Cliche, MD. Schedule an appointment as soon as possible for a visit.   Specialty:  Internal Medicine Contact information: [address] [place] [address] [phone]           Eloise Levels, MD 09/02/2016, 9:46 AM PGY-1, [place]

## 2016-08-30 NOTE — Progress Notes (Signed)
Evaluated patient. Pain 6-7/10. Up to 9/10 with ambulation. No paraesthesias. Right extremity - 2+ DP pulse, 2+ PT pulse. Left Extremity - unable to palpate DP or PT pulses, unable to find pulses with doppler. Resident team to page Orthopedics immediately for further evaluation.   Dossie Arbour MD

## 2016-08-30 NOTE — Progress Notes (Signed)
Family Medicine Teaching Service Daily Progress Note Intern Pager: [phone]  Patient name: Jonathan Novak Medical record number: [id number] Date of birth: 1971-09-06 Age: 45 y.o. Gender: male  Primary Care Provider: Reita Cliche, MD Consultants: ortho Code Status: Full  Pt Overview and Major Events to Date:  1. Admitted to FMTS  Assessment and Plan: Jonathan Novak is a 45 y.o. male presenting with rhabdomyolysis following crush injury two days ago. PMH is significant for history of stroke, hyperlipidemia, tobacco abuse, marijuana use,   # Rhabdomyolysis:  Continuing on 200cc/hr NS, 550CC UOP 24 hrs. CK down to 2537 from 2725. BMP wnl this morning.  Cr 0.94.  Unable to get DP pulse by doppler this morning.  L leg does feel a little more tight than yesterday.  Denies any parasthesias, no change in pallor, pain level or weakness.  No current signs of compartment syndrome, aside from the loss of pulse.  Dr. Lorre Nick with [facility] to see patient this morning to evaluate loss of pulse. Ortho currently recommending ICE,elevation, bulky compressive wrap and PT/OT.   OT recommended no f/u, but 24 supervision and PT recommended no follow up. - Strict Intake and Output, no weight for today - F/U CK  - f/u BMP, CBC - encourage PO intake - monitor resp status - Holding statin. Consider restarting on discharge.  - Orthopedics consulted, appreciate recommendations.  #History of CVA: Hospitalized from 05/05/15-8/15-16 for stroke after presenting with right sided facial droop and slurred speech as well as decreased coordination of right upper and lower extremities. Diagnosed with dominant left basal ganglia infarct. Carotid doppler unremarkable. Echocardiogram normal.  Denies history of hypertension.  - Holding statin given presentation with rhabdomyolysis. Consider restarting on discharge.  - Monitor BP. Initiate antihypertensive if indicated.   # Tobacco Abuse/Marijuana Abuse: Refuses  nicotine patch.  - hold nicotine patch  FEN/GI: Regular Diet Prophylaxis: none- per ortho  Disposition: discharge pending improvement  Subjective:  Patient feels well this morning. Pain well controlled.  Feels very nervous about loss of pulse. Asked for medication and was given 0.75m ativan q6hrs PRN.   Objective: Temp:  [97.5 F (36.4 C)-97.6 F (36.4 C)] 97.5 F (36.4 C) (12/09 0557) Pulse Rate:  [64-78] 78 (12/09 0557) Resp:  [18] 18 (12/09 0557) BP: (127-132)/(70-81) 127/70 (12/09 0557) SpO2:  [99 %-100 %] 100 % (12/09 0557) Physical Exam: General: 443yom[place] resting comfortably in no apparent distress Eyes: non-injected ENTM: MMM Cardiovascular: RRR, no murmurs Respiratory: NWOB, CTABL, no wheezing or rhonci Gastrointestinal: soft NTND MSK: Large hematoma with ecchymosis on L lower extremity.  Moderately tender to palpation.  L leg appears to be a bit tighter.  Patient has passive ROM without pain and is without pain with plantarflexion/dorsiflexion.   Ext: DP and TP pulses heard with doppler on R and DP was absent this AM.  Derm: No rashes noted. Warm. Neuro:  Sensation intact in LE.  No paraesthesias, strength 5/5.  Pain 6/10 Psych: AAOx3. Normal affect.   Laboratory:  Recent Labs Lab 08/28/16 1502 08/29/16 0452 08/30/16 0406  WBC 8.8 5.8 6.4  HGB 13.5 11.9* 11.5*  HCT 38.5* 35.4* 34.7*  PLT 231 214 247    Recent Labs Lab 08/29/16 0452 08/29/16 1511 08/30/16 0406  NA 140 140 139  K 4.2 4.0 3.7  CL 109 110 108  CO2 26 25 25   BUN 12 9 10   CREATININE 0.97 0.99 0.94  CALCIUM 8.3* 8.4* 8.3*  PROT 5.1*  --   --  BILITOT 0.6  --   --   ALKPHOS 39  --   --   ALT 40  --   --   AST 55*  --   --   GLUCOSE 120* 106* 108*    Imaging/Diagnostic Tests: Dg Tibia/fibula Left  Result Date: 08/26/2016 CLINICAL DATA:  Pinned between 2 cars with left leg pain, initial encounter EXAM: LEFT TIBIA AND FIBULA - 2 VIEW COMPARISON:  None. FINDINGS: There is no  evidence of fracture or other focal bone lesions. Soft tissues are unremarkable. IMPRESSION: No acute abnormality noted. Electronically Signed   By: Inez Catalina M.D.   On: 08/26/2016 12:44   Dg Ankle Complete Right  Result Date: 08/26/2016 CLINICAL DATA:  Pinned between 2 cars with right leg pain, initial encounter EXAM: RIGHT ANKLE - COMPLETE 3+ VIEW COMPARISON:  None. FINDINGS: There are changes consistent with prior surgical fixation of in the distal fibula and tibia. No hardware failure is noted. No acute fracture is seen. Significant degenerative changes of the tibiotalar articulation are noted. IMPRESSION: Chronic changes without acute abnormality. Electronically Signed   By: Inez Catalina M.D.   On: 08/26/2016 12:45   Dg Knee Complete 4 Views Left  Result Date: 08/26/2016 CLINICAL DATA:  Pinned between 2 cars with left leg pain, initial encounter EXAM: LEFT KNEE - COMPLETE 4+ VIEW COMPARISON:  None. FINDINGS: No acute fracture or dislocation is noted. Mild meniscal calcifications are seen. No joint effusion is noted. IMPRESSION: No acute abnormality noted. Electronically Signed   By: Inez Catalina M.D.   On: 08/26/2016 12:41   Dg Knee Complete 4 Views Right  Result Date: 08/26/2016 CLINICAL DATA:  Pinned between 2 cars with right leg pain, initial encounter EXAM: RIGHT KNEE - COMPLETE 4+ VIEW COMPARISON:  None. FINDINGS: No acute fracture or dislocation is noted. Mild meniscal calcifications are seen. The patella is not visualized consistent with prior surgical resection. Multiple small bony densities are noted in the expected region of the patella. No joint effusion is seen. IMPRESSION: No acute abnormality noted. Chronic changes as described above. Electronically Signed   By: Inez Catalina M.D.   On: 08/26/2016 12:42   Dg Femur Min 2 Views Left  Result Date: 08/26/2016 CLINICAL DATA:  Pinned between 2 cars with left leg pain, initial encounter EXAM: LEFT FEMUR 2 VIEWS COMPARISON:  None.  FINDINGS: There is no evidence of fracture or other focal bone lesions. Soft tissues are unremarkable. IMPRESSION: No acute abnormality noted. Electronically Signed   By: Inez Catalina M.D.   On: 08/26/2016 12:40   Eloise Levels, MD 08/30/2016, 10:32 AM PGY-1, [place] Intern pager: [phone], text pages welcome

## 2016-08-30 NOTE — Progress Notes (Signed)
PT Cancellation Note  Patient Details Name: Jonathan Novak MRN: [id number] DOB: 07-11-1971   Cancelled Treatment:    Reason Eval/Treat Not Completed: Other (comment) Pt to see pt 1 additional time to attempt stair training. Pt feels confident with stair negotiation as he was performing them at home before coming to hospital. No further PT follow-up is required at this time.    Scheryl Marten PT, DPT  [phone]  08/30/2016, 3:46 PM

## 2016-08-31 LAB — CK: CK TOTAL: 1742 U/L — AB (ref 49–397)

## 2016-08-31 LAB — BASIC METABOLIC PANEL
ANION GAP: 6 (ref 5–15)
BUN: 12 mg/dL (ref 6–20)
CHLORIDE: 108 mmol/L (ref 101–111)
CO2: 25 mmol/L (ref 22–32)
Calcium: 8.5 mg/dL — ABNORMAL LOW (ref 8.9–10.3)
Creatinine, Ser: 1 mg/dL (ref 0.61–1.24)
GFR calc Af Amer: >60 mL/min (ref >60)
Glucose, Bld: 114 mg/dL — ABNORMAL HIGH (ref 65–99)
POTASSIUM: 3.7 mmol/L (ref 3.5–5.1)
SODIUM: 139 mmol/L (ref 135–145)

## 2016-08-31 MED ORDER — OXYCODONE-ACETAMINOPHEN 5-325 MG PO TABS: 1.0000 | ORAL_TABLET | Freq: Four times a day (QID) | ORAL | 0 refills | Status: DC | PRN

## 2016-08-31 NOTE — Progress Notes (Signed)
Family Medicine Teaching Service Daily Progress Note Intern Pager: [phone]  Patient name: Jonathan Novak Medical record number: [id number] Date of birth: 11-21-1970 Age: 45 y.o. Gender: male  Primary Care Provider: Reita Cliche, MD Consultants: ortho Code Status: Full  Pt Overview and Major Events to Date:  08/28/16 Admitted to FMTS  Assessment and Plan: Jonathan Novak is a 45 y.o. male presenting with rhabdomyolysis following crush injury two days ago. PMH is significant for history of stroke, hyperlipidemia, tobacco abuse, marijuana use,   # Rhabdomyolysis:  Improving. - Strict Intake and Output - F/U CK - trending downward, today 1742 - monitor Creatinine, stable at 1.00 - IV fluids at 100cc/hr - daily BMP, CBC - encourage PO intake - monitor resp status - Holding statin. Consider restarting on discharge.  - Orthopedics consulted, appreciate recommendations- no concern currently for compartment syndrome, recommend outpatient follow up - PT/OT following  #History of CVA: Hospitalized from 05/05/15-8/15-16 for stroke after presenting with right sided facial droop and slurred speech as well as decreased coordination of right upper and lower extremities. Diagnosed with dominant left basal ganglia infarct. Carotid doppler unremarkable. Echocardiogram normal.  Denies history of hypertension.  - Holding statin given presentation with rhabdomyolysis. Consider restarting on discharge.  - Monitor BP. Initiate antihypertensive if indicated.   # Tobacco Abuse/Marijuana Abuse: Refuses nicotine patch.  - hold nicotine patch  FEN/GI: Regular Diet Prophylaxis: none- per ortho  Disposition: home today  Subjective:  Patient is doing well today. The pain is controlled. He is elevating legs. Has been able to ambulate.  Objective: Temp:  [97.5 F (36.4 C)-99.5 F (37.5 C)] 98.1 F (36.7 C) (12/09 2026) Pulse Rate:  [73-78] 73 (12/09 2026) Resp:  [18] 18 (12/09 2026) BP:  (127-133)/(69-84) 130/69 (12/09 2026) SpO2:  [98 %-100 %] 98 % (12/09 2026) Physical Exam: General: 45yo male resting comfortably in no apparent distress Cardiovascular: RRR, no murmurs Respiratory: NWOB, CTABL, no wheezing or rhonci Gastrointestinal: soft NTND MSK: Large hematoma with ecchymosis on L lower extremity.  Moderately tender to palpation.  L leg with edema more than R.  Patient has passive ROM without pain and is without pain with plantarflexion/dorsiflexion.    Derm: No rashes noted. Warm. Neuro:  Sensation intact in LE.  No paraesthesias, strength 5/5.  Laboratory:  Recent Labs Lab 08/28/16 1502 08/29/16 0452 08/30/16 0406  WBC 8.8 5.8 6.4  HGB 13.5 11.9* 11.5*  HCT 38.5* 35.4* 34.7*  PLT 231 214 247    Recent Labs Lab 08/29/16 0452 08/29/16 1511 08/30/16 0406  NA 140 140 139  K 4.2 4.0 3.7  CL 109 110 108  CO2 26 25 25   BUN 12 9 10   CREATININE 0.97 0.99 0.94  CALCIUM 8.3* 8.4* 8.3*  PROT 5.1*  --   --   BILITOT 0.6  --   --   ALKPHOS 39  --   --   ALT 40  --   --   AST 55*  --   --   GLUCOSE 120* 106* 108*    Imaging/Diagnostic Tests: Dg Tibia/fibula Left  Result Date: 08/26/2016 CLINICAL DATA:  Pinned between 2 cars with left leg pain, initial encounter EXAM: LEFT TIBIA AND FIBULA - 2 VIEW COMPARISON:  None. FINDINGS: There is no evidence of fracture or other focal bone lesions. Soft tissues are unremarkable. IMPRESSION: No acute abnormality noted. Electronically Signed   By: Inez Catalina M.D.   On: 08/26/2016 12:44   Dg Ankle Complete Right  Result Date: 08/26/2016  CLINICAL DATA:  Pinned between 2 cars with right leg pain, initial encounter EXAM: RIGHT ANKLE - COMPLETE 3+ VIEW COMPARISON:  None. FINDINGS: There are changes consistent with prior surgical fixation of in the distal fibula and tibia. No hardware failure is noted. No acute fracture is seen. Significant degenerative changes of the tibiotalar articulation are noted. IMPRESSION: Chronic  changes without acute abnormality. Electronically Signed   By: Inez Catalina M.D.   On: 08/26/2016 12:45   Dg Knee Complete 4 Views Left  Result Date: 08/26/2016 CLINICAL DATA:  Pinned between 2 cars with left leg pain, initial encounter EXAM: LEFT KNEE - COMPLETE 4+ VIEW COMPARISON:  None. FINDINGS: No acute fracture or dislocation is noted. Mild meniscal calcifications are seen. No joint effusion is noted. IMPRESSION: No acute abnormality noted. Electronically Signed   By: Inez Catalina M.D.   On: 08/26/2016 12:41   Dg Knee Complete 4 Views Right  Result Date: 08/26/2016 CLINICAL DATA:  Pinned between 2 cars with right leg pain, initial encounter EXAM: RIGHT KNEE - COMPLETE 4+ VIEW COMPARISON:  None. FINDINGS: No acute fracture or dislocation is noted. Mild meniscal calcifications are seen. The patella is not visualized consistent with prior surgical resection. Multiple small bony densities are noted in the expected region of the patella. No joint effusion is seen. IMPRESSION: No acute abnormality noted. Chronic changes as described above. Electronically Signed   By: Inez Catalina M.D.   On: 08/26/2016 12:42   Dg Femur Min 2 Views Left  Result Date: 08/26/2016 CLINICAL DATA:  Pinned between 2 cars with left leg pain, initial encounter EXAM: LEFT FEMUR 2 VIEWS COMPARISON:  None. FINDINGS: There is no evidence of fracture or other focal bone lesions. Soft tissues are unremarkable. IMPRESSION: No acute abnormality noted. Electronically Signed   By: Inez Catalina M.D.   On: 08/26/2016 12:40   Steve Rattler, DO 08/31/2016, 4:01 AM PGY-1, [facility] Intern pager: [phone], text pages welcome

## 2016-08-31 NOTE — Discharge Instructions (Signed)
Rhabdomyolysis Rhabdomyolysis is a condition that happens when muscle cells break down and release substances into the blood that can damage the kidneys. This condition happens because of damage to the muscles that move bones (skeletal muscle). When the skeletal muscles are damaged, substances inside the muscle cells go into the blood. One of these substances is a protein called myoglobin. Large amounts of myoglobin can cause kidney damage or kidney failure. Other substances that are released by muscle cells may upset the balance of the minerals (electrolytes) in your blood. This imbalance causes your blood to have too much acid (acidosis). What are the causes? This condition is caused by muscle damage. Muscle damage often happens because of:  Using your muscles too much.  An injury that crushes or squeezes a muscle too tightly.  Using illegal drugs, mainly cocaine.  Alcohol abuse. Other possible causes include:  Prescription medicines, such as those that:  Lower cholesterol (statins).  Treat ADHD (attention deficit hyperactivity disorder) or help with weight loss (amphetamines).  Treat pain (opiates).  Infections.  Muscle diseases that are passed down from parent to child (inherited).  High fever.  Heatstroke.  Not having enough fluids in your body (dehydration).  Seizures.  Surgery. What increases the risk? This condition is more likely to develop in people who:  Have a family history of muscle disease.  Take part in extreme sports, such as running in marathons.  Have diabetes.  Are older.  Abuse drugs or alcohol. What are the signs or symptoms? Symptoms of this condition vary. Some people have very few symptoms, and other people have many symptoms. The most common symptoms include:  Muscle pain and swelling.  Weak muscles.  Dark urine.  Feeling weak and tired. Other symptoms include:  Nausea and vomiting.  Fever.  Pain in the abdomen.  Pain in the  joints. Symptoms of complications from this condition include:  Heart rhythm that is not normal (arrhythmia).  Seizures.  Not urinating enough because of kidney failure.  Very low blood pressure (shock). Signs of shock include dizziness, blurry vision, and clammy skin.  Bleeding that is hard to stop or control. How is this diagnosed? This condition is diagnosed based on your medical history, your symptoms, and a physical exam. Tests may also be done, including:  Blood tests.  Urine tests to check for myoglobin. You may also have other tests to check for causes of muscle damage and to check for complications. How is this treated? Treatment for this condition helps to:  Make sure you have enough fluids in your body.  Lower the acid levels in your blood to reverse acidosis.  Protect your kidneys. Treatment may include:  Fluids and medicines given through an IV tube that is inserted into one of your veins.  Medicines to lower acidosis or to bring back the balance of the minerals in your body.  Hemodialysis. This treatment uses an artificial kidney machine to filter your blood while you recover. You may have this if other treatments are not helping. Follow these instructions at home:  Take over-the-counter and prescription medicines only as told by your health care provider.  Rest at home until your health care provider says that you can return to your normal activities.  Drink enough fluid to keep your urine clear or pale yellow.  Do not do activities that take a lot of effort (are strenuous). Ask your health care provider what level of exercise is safe for you.  Do not abuse drugs or alcohol.  If you are having problems with drug or alcohol use, ask your health care provider for help.  Keep all follow-up visits as told by your health care provider. This is important. Contact a health care provider if:  You start having symptoms of this condition after treatment. Get help  right away if:  You have a seizure.  You bleed easily or cannot control bleeding.  You cannot urinate.  You have chest pain.  You have trouble breathing. This information is not intended to replace advice given to you by your health care provider. Make sure you discuss any questions you have with your health care provider. Document Released: 08/21/2004 Document Revised: 06/20/2016 Document Reviewed: 06/20/2016 Elsevier Interactive Patient Education  2017 [employer].

## 2016-08-31 NOTE — Progress Notes (Signed)
Discharge instruction provided to patient and he received his paper prescriptions from this RN. All questions answered and patient is ready to discharge.

## 2018-05-11 IMAGING — CR DG KNEE COMPLETE 4+V*L*
4 series · 4 of 4 positions shown · non-contrast
Comparison: None.

CLINICAL DATA: Pinned between 2 cars with left leg pain, initial
encounter

EXAM:
LEFT KNEE - COMPLETE 4+ VIEW

[knee ap]
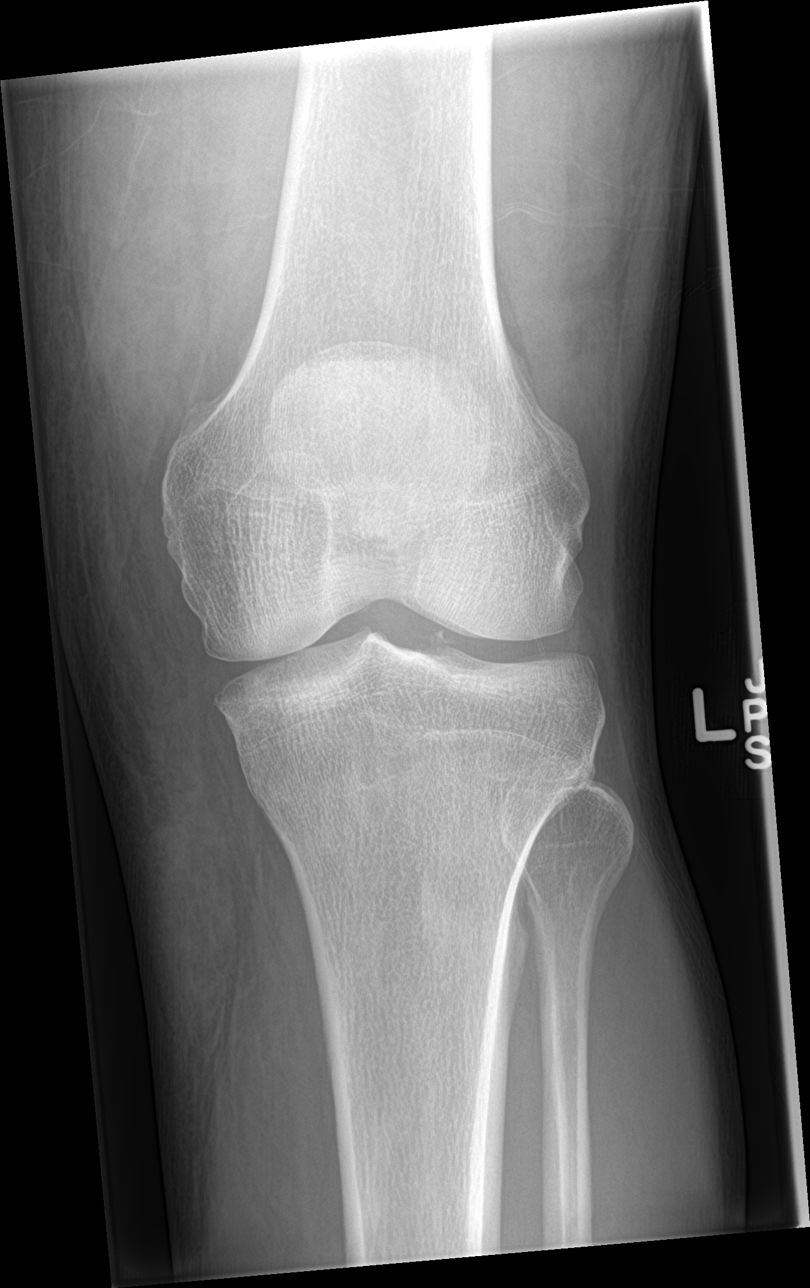

[knee lat]
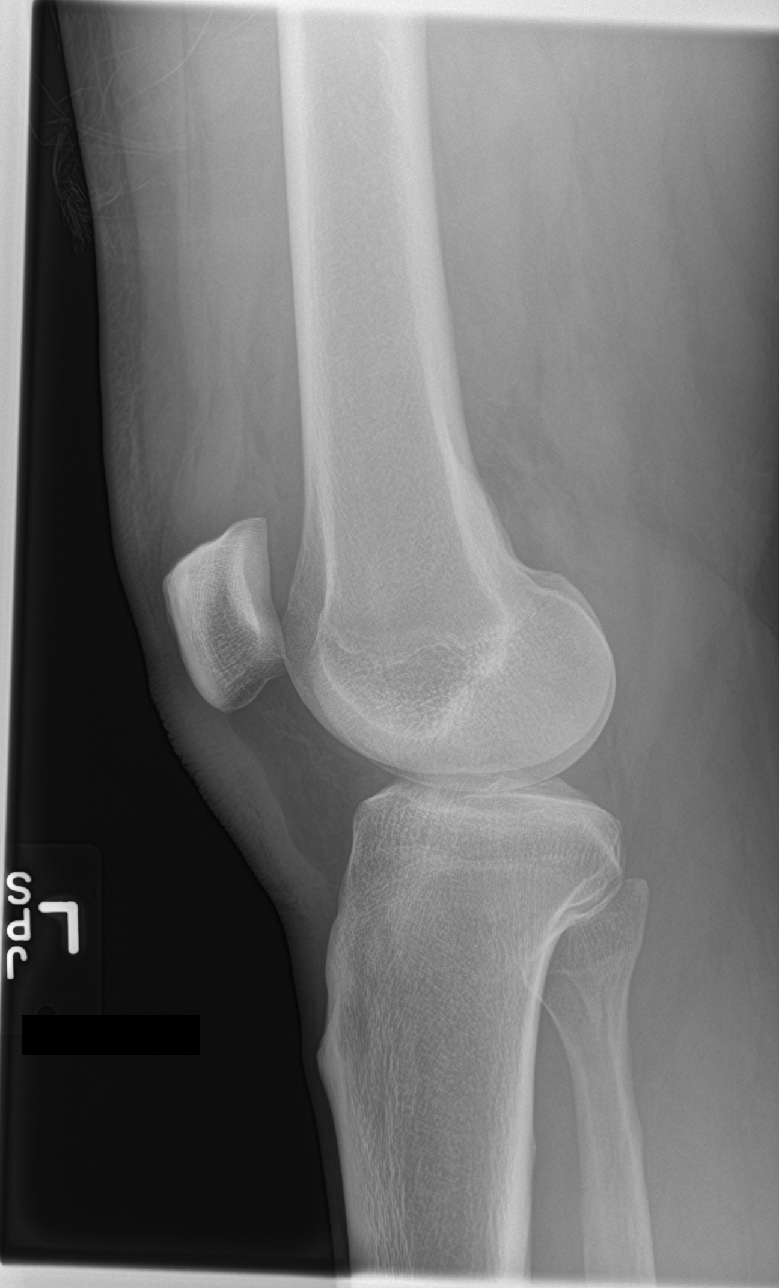

[knee obl (1 of 2)]
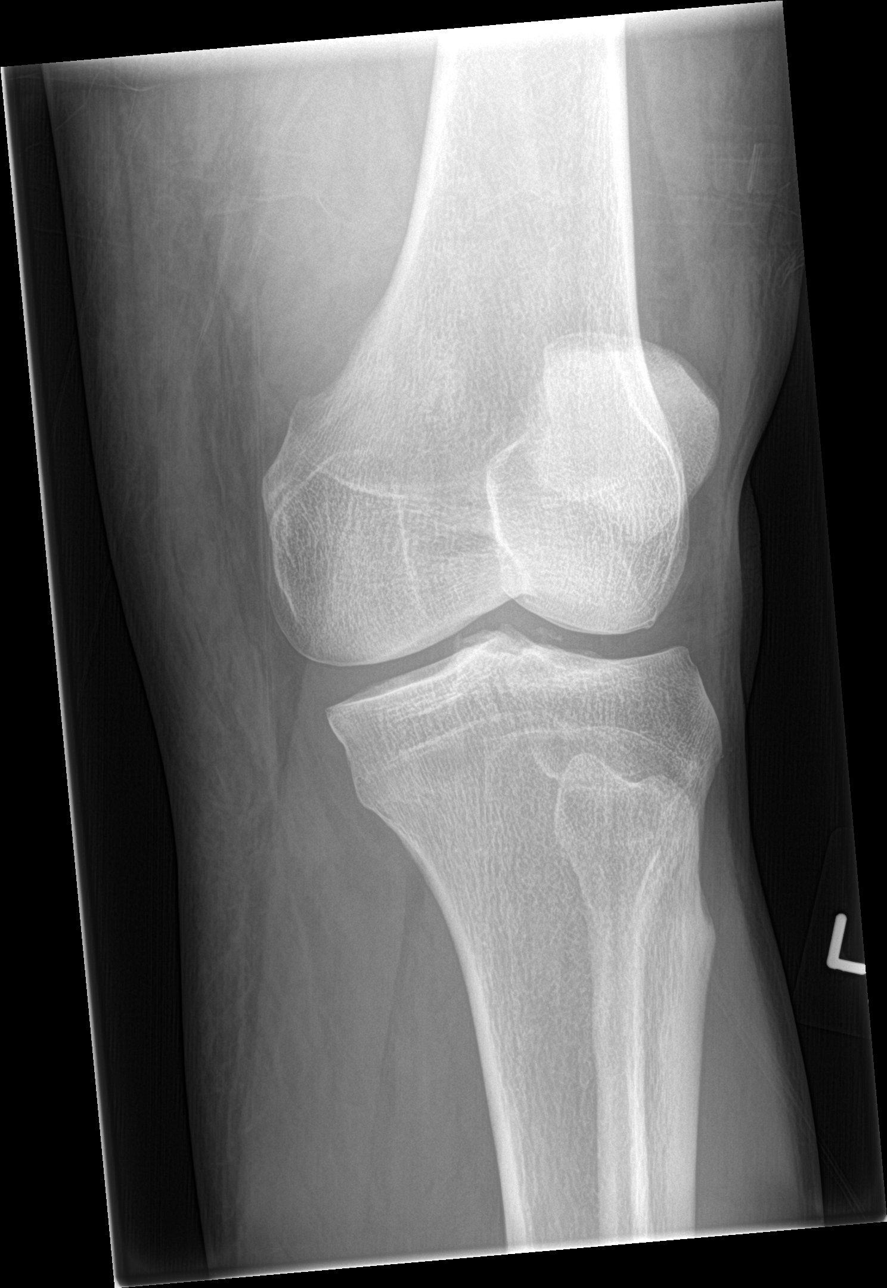

[knee obl (2 of 2)]
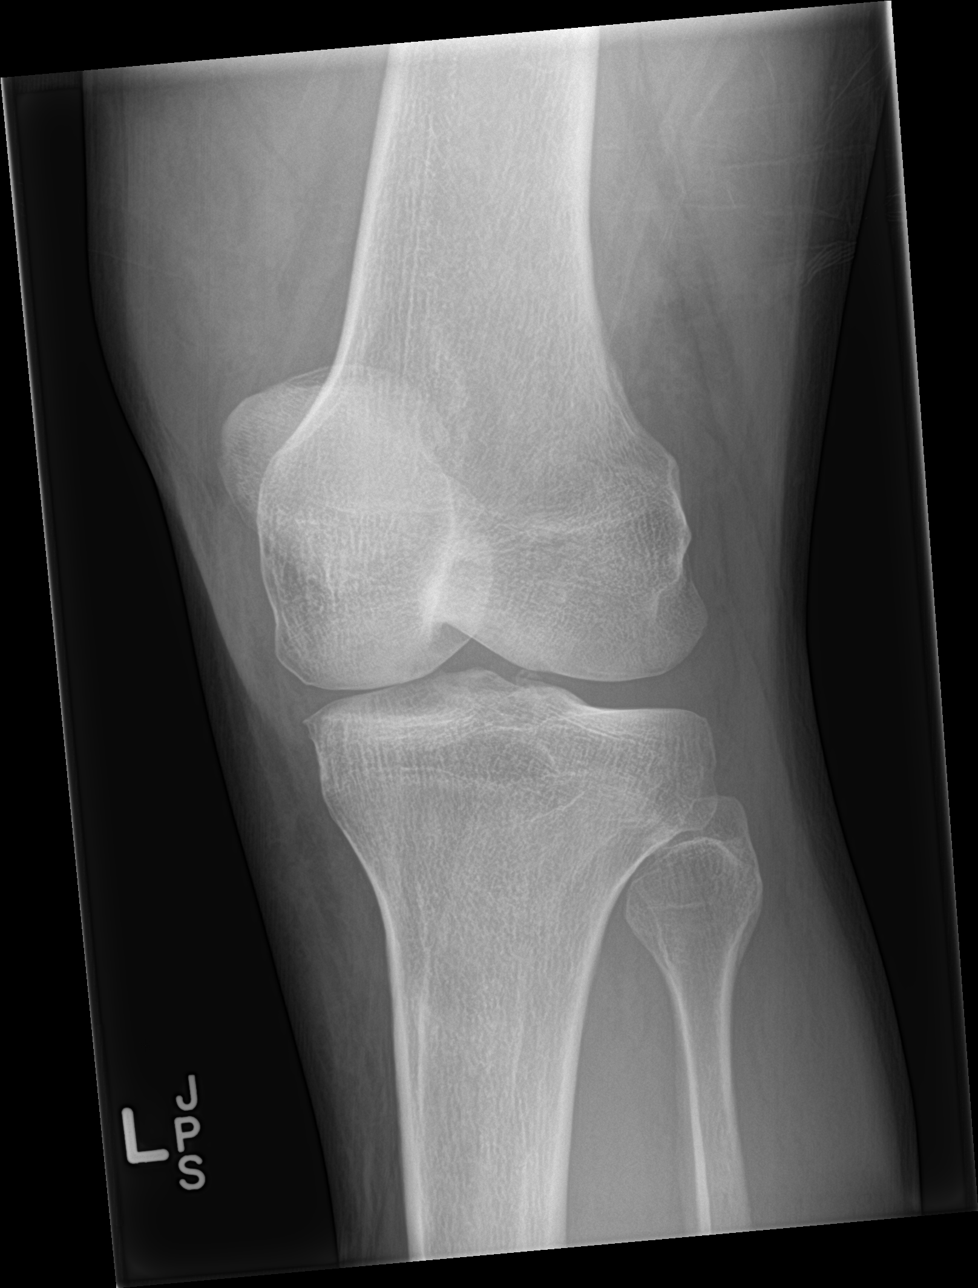

[4 of 4 positions shown; findings below may reference images not displayed]

FINDINGS: No acute fracture or dislocation is noted. Mild meniscal
calcifications are seen. No joint effusion is noted.
IMPRESSION: No acute abnormality noted.

## 2019-07-03 ENCOUNTER — Other Ambulatory Visit: Payer: Self-pay

## 2019-07-03 ENCOUNTER — Emergency Department (HOSPITAL_BASED_OUTPATIENT_CLINIC_OR_DEPARTMENT_OTHER)
Admission: EM | Admit: 2019-07-03 | Discharge: 2019-07-03 | Disposition: A | Discharge status: Home / Self Care | Payer: BC Managed Care – PPO | Attending: Emergency Medicine | Admitting: Emergency Medicine

## 2019-07-03 ENCOUNTER — Encounter (HOSPITAL_BASED_OUTPATIENT_CLINIC_OR_DEPARTMENT_OTHER): Payer: Self-pay | Admitting: Emergency Medicine

## 2019-07-03 DIAGNOSIS — Y999 Unspecified external cause status: Secondary | ICD-10-CM | POA: Diagnosis not present

## 2019-07-03 DIAGNOSIS — Z7982 Long term (current) use of aspirin: Secondary | ICD-10-CM | POA: Insufficient documentation

## 2019-07-03 DIAGNOSIS — E782 Mixed hyperlipidemia: Secondary | ICD-10-CM | POA: Diagnosis not present

## 2019-07-03 DIAGNOSIS — S46812A Strain of other muscles, fascia and tendons at shoulder and upper arm level, left arm, initial encounter: Secondary | ICD-10-CM | POA: Insufficient documentation

## 2019-07-03 DIAGNOSIS — Y929 Unspecified place or not applicable: Secondary | ICD-10-CM | POA: Insufficient documentation

## 2019-07-03 DIAGNOSIS — Y9389 Activity, other specified: Secondary | ICD-10-CM | POA: Diagnosis not present

## 2019-07-03 DIAGNOSIS — F17210 Nicotine dependence, cigarettes, uncomplicated: Secondary | ICD-10-CM | POA: Insufficient documentation

## 2019-07-03 DIAGNOSIS — X509XXA Other and unspecified overexertion or strenuous movements or postures, initial encounter: Secondary | ICD-10-CM | POA: Insufficient documentation

## 2019-07-03 DIAGNOSIS — Z79899 Other long term (current) drug therapy: Secondary | ICD-10-CM | POA: Diagnosis not present

## 2019-07-03 DIAGNOSIS — S46912A Strain of unspecified muscle, fascia and tendon at shoulder and upper arm level, left arm, initial encounter: Secondary | ICD-10-CM

## 2019-07-03 DIAGNOSIS — Z8673 Personal history of transient ischemic attack (TIA), and cerebral infarction without residual deficits: Secondary | ICD-10-CM | POA: Diagnosis not present

## 2019-07-03 DIAGNOSIS — I10 Essential (primary) hypertension: Secondary | ICD-10-CM | POA: Diagnosis not present

## 2019-07-03 DIAGNOSIS — S4992XA Unspecified injury of left shoulder and upper arm, initial encounter: Secondary | ICD-10-CM | POA: Diagnosis present

## 2019-07-03 DIAGNOSIS — M62838 Other muscle spasm: Secondary | ICD-10-CM

## 2019-07-03 MED ORDER — CYCLOBENZAPRINE HCL 10 MG PO TABS: 10.0000 mg | ORAL_TABLET | Freq: Two times a day (BID) | ORAL | 0 refills | Status: DC | PRN

## 2019-07-03 MED ORDER — LIDOCAINE 5 % EX PTCH: 1.0000 | MEDICATED_PATCH | CUTANEOUS | 0 refills | Status: DC

## 2019-07-03 MED ORDER — KETOROLAC TROMETHAMINE 60 MG/2ML IM SOLN
60.0000 mg | Freq: Once | INTRAMUSCULAR | Status: AC
  Administered 2019-07-03: 08:00:00 60 mg via INTRAMUSCULAR
  Filled 2019-07-03: qty 2

## 2019-07-03 NOTE — ED Triage Notes (Signed)
L shoulder pain/throbbing for 2 days. Reports no trauma. Full ROM. Tramadol not effective. Also took some of his wife's anxiety meds?

## 2019-07-03 NOTE — ED Provider Notes (Signed)
[facility] EMERGENCY DEPARTMENT Provider Note   CSN: [id number] Arrival date & time: 07/03/19  1740     History   Chief Complaint Chief Complaint  Patient presents with  . Shoulder Pain    HPI Jonathan Novak is a 48 y.o. male.     HPI  2 days of left shoulder pain Constant throbbing Pressure washed a van, moved some things, can't think of exact incident that made it worse Worse when looking up Better when putting arm up and resting on something Couldn't sleep last night due to severity of pain Can feel knots in the back Tried heating pads, ice, wife tried rubbing it, having to look down because hurts to look up  No fevers, CP, dyspnea   Past Medical History:  Diagnosis Date  . Complication of anesthesia   . Hypercholesteremia   . PONV (postoperative nausea and vomiting)   . Rhabdomyolysis 08/2016    rhabdomyolisis following crush injury 12/5  . Stroke [facility])     Patient Active Problem List   Diagnosis Date Noted  . Difficulty in walking, not elsewhere classified   . Crush injury lower leg, left, subsequent encounter   . Rhabdomyolysis 08/28/2016  . Smoker 07/11/2015  . Marijuana abuse 07/11/2015  . HLD (hyperlipidemia) 07/11/2015  . Essential hypertension   . Acute ischemic stroke ([place]) 05/06/2015  . Hyperlipidemia 05/06/2015  . Stroke ([place]) 05/05/2015  . Tobacco abuse 05/05/2015  . Marijuana use 05/05/2015    Past Surgical History:  Procedure Laterality Date  . ANKLE SURGERY Right   . PATELLA FRACTURE SURGERY Right         Home Medications    Prior to Admission medications   Medication Sig Start Date End Date Taking? Authorizing Provider  traMADol (ULTRAM) 50 MG tablet Take 1 tablet (50 mg total) by mouth every 12 (twelve) hours as needed. Patient taking differently: Take 50 mg by mouth every 12 (twelve) hours as needed for moderate pain.  05/07/15  Yes Delfina Redwood, MD  aspirin 325 MG tablet Take 1 tablet (325 mg total)  by mouth daily. 05/07/15   Delfina Redwood, MD  atorvastatin (LIPITOR) 80 MG tablet Take 1 tablet (80 mg total) by mouth daily at 6 PM. Patient not taking: Reported on 08/28/2016 05/07/15   Delfina Redwood, MD  cetirizine (ZYRTEC) 10 MG tablet Take 10 mg by mouth at bedtime.    [provider]  cyclobenzaprine (FLEXERIL) 10 MG tablet Take 1 tablet (10 mg total) by mouth 2 (two) times daily as needed for muscle spasms. 07/03/19   Jonathan Morgan, MD  lidocaine (LIDODERM) 5 % Place 1 patch onto the skin daily. Remove & Discard patch within 12 hours or as directed by MD 07/03/19   Jonathan Morgan, MD  Multiple Vitamin (MULTIVITAMIN WITH MINERALS) TABS tablet Take 1 tablet by mouth daily.    [provider]  naproxen (NAPROSYN) 500 MG tablet Take 1 tablet (500 mg total) by mouth 2 (two) times daily with a meal. 08/26/16   Sherwood Gambler, MD  nicotine (NICODERM CQ - DOSED IN MG/24 HOURS) 21 mg/24hr patch Place 1 patch (21 mg total) onto the skin daily. Patient not taking: Reported on 08/28/2016 05/07/15   Delfina Redwood, MD  oxyCODONE-acetaminophen (PERCOCET) 5-325 MG tablet Take 1 tablet by mouth every 4 (four) hours as needed for severe pain. 08/26/16   Sherwood Gambler, MD  oxyCODONE-acetaminophen (PERCOCET/ROXICET) 5-325 MG tablet Take 1-2 tablets by mouth every 6 (six) hours  as needed for severe pain. 08/31/16   Glenis Smoker, MD  Pitavastatin Calcium (LIVALO) 2 MG TABS Take 2 mg by mouth at bedtime. 07/09/16   [provider]  Testosterone 20.25 MG/ACT (1.62%) GEL Apply 3 Squirts topically every morning. shoulders 07/25/16   [provider]    Family History Family History  Problem Relation Age of Onset  . Diabetes Mother   . Hypertension Mother   . Stroke Mother   . Diabetes Mellitus I Mother   . Heart disease Mother   . Stroke Father   . Kidney disease Father     Social History Social History   Tobacco Use  . Smoking status:  Current Every Day Smoker    Packs/day: 0.25    Types: E-cigarettes  . Smokeless tobacco: Never Used  Substance Use Topics  . Alcohol use: No    Comment: occasionally  . Drug use: Yes    Types: Other-see comments, Marijuana     Allergies   Patient has no known allergies.   Review of Systems Review of Systems  Constitutional: Negative for fever.  Respiratory: Negative for cough and shortness of breath.   Cardiovascular: Negative for chest pain.  Gastrointestinal: Negative for nausea and vomiting.  Musculoskeletal: Positive for arthralgias, myalgias and neck pain.  Skin: Negative for rash.  Neurological: Negative for headaches.     Physical Exam Updated Vital Signs BP (!) 142/97 (BP Location: Right Arm)   Pulse 65   Temp 97.7 F (36.5 C) (Oral)   Resp 18   Ht 6' (1.829 m)   Wt 99.8 kg   SpO2 100%   BMI 29.84 kg/m   Physical Exam Vitals signs and nursing note reviewed.  Constitutional:      General: He is not in acute distress.    Appearance: He is well-developed. He is not diaphoretic.  HENT:     Head: Normocephalic and atraumatic.  Eyes:     Conjunctiva/sclera: Conjunctivae normal.  Neck:     Musculoskeletal: Normal range of motion.  Cardiovascular:     Rate and Rhythm: Normal rate and regular rhythm.     Pulses: Normal pulses.     Heart sounds: Normal heart sounds.  Pulmonary:     Effort: Pulmonary effort is normal. No respiratory distress.     Breath sounds: Normal breath sounds. No wheezing or rales.  Abdominal:     General: There is no distension.     Palpations: Abdomen is soft.     Tenderness: There is no abdominal tenderness. There is no guarding.  Musculoskeletal:     Left shoulder: He exhibits tenderness and bony tenderness. He exhibits no deformity and no laceration. Decreased range of motion: pain with ROM but does not appear limited.  Skin:    General: Skin is warm and dry.  Neurological:     Mental Status: He is alert and oriented to  person, place, and time.      ED Treatments / Results  Labs (all labs ordered are listed, but only abnormal results are displayed) Labs Reviewed - No data to display  EKG None  Radiology No results found.  Procedures Procedures (including critical care time)  Medications Ordered in ED Medications  ketorolac (TORADOL) injection 60 mg (60 mg Intramuscular Given 07/03/19 0741)     Initial Impression / Assessment and Plan / ED Course  I have reviewed the triage vital signs and the nursing notes.  Pertinent labs & imaging results that were available during my  care of the patient were reviewed by me and considered in my medical decision making (see chart for details).        48yo male with history of hypercholesterolemia presents with concern for severe left shoulder pain.  Pain is significantly worse with movements including arm movements and looking up.  Doubt intrathoracic etiology such as dissection, ACS, PE or intraabdominal etiology by hx/exam.  Normal bilateral pulses. Doubt acute arterial occlusion, no sign of DVT. No erythema, no fever, no IVDU doubt septic arthritis. No signs of acute cord compression. Hx not consistent with SAH.  Suspect most likely MSK etiology, possible rotator cuff but most consistent with trapezius muscle strain and spasm.  Given rx for flexeril, lidoderm patch. Patient discharged in stable condition with understanding of reasons to return.    Final Clinical Impressions(s) / ED Diagnoses   Final diagnoses:  Strain of left shoulder, initial encounter  Trapezius muscle spasm    ED Discharge Orders         Ordered    cyclobenzaprine (FLEXERIL) 10 MG tablet  2 times daily PRN,   Status:  Discontinued     07/03/19 0728    lidocaine (LIDODERM) 5 %  Every 24 hours,   Status:  Discontinued     07/03/19 0728    cyclobenzaprine (FLEXERIL) 10 MG tablet  2 times daily PRN     07/03/19 0747    lidocaine (LIDODERM) 5 %  Every 24 hours     07/03/19 0747            Jonathan Morgan, MD 07/04/19 1431

## 2020-02-28 ENCOUNTER — Telehealth: Payer: Self-pay

## 2020-02-28 NOTE — Telephone Encounter (Signed)
Patient called in to see if he could become a new patient with Dr. Lorelei Pont. Patient wife is a patient of Dr. Lorelei Pont. Please give the patient a call back to advise.

## 2020-02-29 NOTE — Telephone Encounter (Signed)
Scheduled as new patient.
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
## 2020-03-08 ENCOUNTER — Ambulatory Visit (INDEPENDENT_AMBULATORY_CARE_PROVIDER_SITE_OTHER): Payer: BC Managed Care – PPO | Admitting: Family Medicine

## 2020-03-08 ENCOUNTER — Encounter: Payer: Self-pay | Admitting: Family Medicine

## 2020-03-08 ENCOUNTER — Other Ambulatory Visit: Payer: Self-pay

## 2020-03-08 VITALS — BP 126/82 | HR 62 | Resp 16 | Ht 72.0 in | Wt 227.0 lb

## 2020-03-08 DIAGNOSIS — E1169 Type 2 diabetes mellitus with other specified complication: Secondary | ICD-10-CM

## 2020-03-08 DIAGNOSIS — E785 Hyperlipidemia, unspecified: Secondary | ICD-10-CM | POA: Diagnosis not present

## 2020-03-08 DIAGNOSIS — Z130 Encounter for screening for diseases of the blood and blood-forming organs and certain disorders involving the immune mechanism: Secondary | ICD-10-CM | POA: Diagnosis not present

## 2020-03-08 DIAGNOSIS — R7303 Prediabetes: Secondary | ICD-10-CM

## 2020-03-08 DIAGNOSIS — M25561 Pain in right knee: Secondary | ICD-10-CM

## 2020-03-08 DIAGNOSIS — G8929 Other chronic pain: Secondary | ICD-10-CM

## 2020-03-08 DIAGNOSIS — Z1211 Encounter for screening for malignant neoplasm of colon: Secondary | ICD-10-CM

## 2020-03-08 DIAGNOSIS — S99911A Unspecified injury of right ankle, initial encounter: Secondary | ICD-10-CM | POA: Diagnosis not present

## 2020-03-08 DIAGNOSIS — Z8673 Personal history of transient ischemic attack (TIA), and cerebral infarction without residual deficits: Secondary | ICD-10-CM

## 2020-03-08 DIAGNOSIS — I10 Essential (primary) hypertension: Secondary | ICD-10-CM | POA: Diagnosis not present

## 2020-03-08 LAB — COMPREHENSIVE METABOLIC PANEL
ALT: 25 U/L (ref 0–53)
AST: 21 U/L (ref 0–37)
Albumin: 4.4 g/dL (ref 3.5–5.2)
Alkaline Phosphatase: 83 U/L (ref 39–117)
BUN: 14 mg/dL (ref 6–23)
CO2: 28 mEq/L (ref 19–32)
Calcium: 9.6 mg/dL (ref 8.4–10.5)
Chloride: 105 mEq/L (ref 96–112)
Creatinine, Ser: 1.08 mg/dL (ref 0.40–1.50)
GFR: 72.82 mL/min (ref >60.00)
Glucose, Bld: 103 mg/dL — ABNORMAL HIGH (ref 70–99)
Potassium: 3.7 mEq/L (ref 3.5–5.1)
Sodium: 141 mEq/L (ref 135–145)
Total Bilirubin: 0.7 mg/dL (ref 0.2–1.2)
Total Protein: 6.7 g/dL (ref 6.0–8.3)

## 2020-03-08 LAB — LIPID PANEL
Cholesterol: 167 mg/dL (ref 0–200)
HDL: 34.7 mg/dL — ABNORMAL LOW (ref >39.00)
NonHDL: 132.36
Total CHOL/HDL Ratio: 5
Triglycerides: 210 mg/dL — ABNORMAL HIGH (ref 0.0–149.0)
VLDL: 42 mg/dL — ABNORMAL HIGH (ref 0.0–40.0)

## 2020-03-08 LAB — CBC
HCT: 43.4 % (ref 39.0–52.0)
Hemoglobin: 14.9 g/dL (ref 13.0–17.0)
MCHC: 34.4 g/dL (ref 30.0–36.0)
MCV: 82.8 fl (ref 78.0–100.0)
Platelets: 300 10*3/uL (ref 150.0–400.0)
RBC: 5.24 Mil/uL (ref 4.22–5.81)
RDW: 13 % (ref 11.5–15.5)
WBC: 6.3 10*3/uL (ref 4.0–10.5)

## 2020-03-08 LAB — LDL CHOLESTEROL, DIRECT: Direct LDL: 101 mg/dL

## 2020-03-08 LAB — HEMOGLOBIN A1C: Hgb A1c MFr Bld: 5.9 % (ref 4.6–6.5)

## 2020-03-08 MED ORDER — TRAMADOL HCL 50 MG PO TABS: 50.0000 mg | ORAL_TABLET | Freq: Four times a day (QID) | ORAL | 1 refills | Status: AC | PRN

## 2020-03-08 MED ORDER — ATORVASTATIN CALCIUM 80 MG PO TABS: 80.0000 mg | ORAL_TABLET | Freq: Every day | ORAL | 3 refills | Status: AC

## 2020-03-08 MED ORDER — LOSARTAN POTASSIUM 50 MG PO TABS: 50.0000 mg | ORAL_TABLET | Freq: Every day | ORAL | 3 refills | Status: AC

## 2020-03-08 NOTE — Patient Instructions (Signed)
It was very nice to meet you today I will be in touch with your labs asap, and we will set you up for a Cologuard kit to screen for colon cancer I will send you to see a foot/ ankle surgeon and also more general orthopedist regarding your ankle and foot injury- there may be other options for longer term management of your injuries and pain that they can offer you  Take care, please see me in about 6 months

## 2020-04-19 LAB — COLOGUARD: Cologuard: NEGATIVE

## 2020-04-25 ENCOUNTER — Encounter: Payer: Self-pay | Admitting: Family Medicine

## 2020-05-14 ENCOUNTER — Telehealth: Payer: Self-pay | Admitting: Family Medicine
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
## 2020-05-14 NOTE — Telephone Encounter (Signed)
Error

## 2020-05-16 ENCOUNTER — Other Ambulatory Visit: Payer: Self-pay

## 2020-05-16 ENCOUNTER — Telehealth (INDEPENDENT_AMBULATORY_CARE_PROVIDER_SITE_OTHER): Payer: BC Managed Care – PPO | Admitting: Family Medicine

## 2020-05-16 DIAGNOSIS — S161XXA Strain of muscle, fascia and tendon at neck level, initial encounter: Secondary | ICD-10-CM

## 2020-05-16 MED ORDER — PREDNISONE 20 MG PO TABS: ORAL_TABLET | ORAL | 0 refills | Status: AC

## 2020-05-16 MED ORDER — METHOCARBAMOL 750 MG PO TABS: 750.0000 mg | ORAL_TABLET | Freq: Three times a day (TID) | ORAL | 0 refills | Status: AC | PRN

## 2021-07-23 DEATH — deceased
